# Patient Record
Sex: Female | Born: 1995 | Race: Black or African American | Hispanic: No | Marital: Single | Smoking: Former smoker
Health system: Southern US, Community
[De-identification: ages and names within clinical notes are randomized; demographics above are authoritative.]

## PROBLEM LIST (undated history)

## (undated) DIAGNOSIS — A539 Syphilis, unspecified: Secondary | ICD-10-CM

## (undated) DIAGNOSIS — N63 Unspecified lump in unspecified breast: Secondary | ICD-10-CM

## (undated) HISTORY — DX: Syphilis, unspecified: A53.9

## (undated) HISTORY — PX: UMBILICAL HERNIA REPAIR: SHX196

## (undated) HISTORY — PX: HERNIA REPAIR: SHX51

---
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
## 2014-02-13 ENCOUNTER — Emergency Department: Payer: Self-pay | Admitting: Emergency Medicine

## 2014-02-13 LAB — URINALYSIS, COMPLETE
BACTERIA: NONE SEEN
BILIRUBIN, UR: NEGATIVE
Glucose,UR: NEGATIVE mg/dL (ref 0–75)
NITRITE: NEGATIVE
Ph: 5 (ref 4.5–8.0)
Specific Gravity: 1.027 (ref 1.003–1.030)
Squamous Epithelial: 15

## 2014-05-04 ENCOUNTER — Emergency Department: Payer: Self-pay | Admitting: Internal Medicine

## 2015-06-03 ENCOUNTER — Emergency Department
Admission: EM | Admit: 2015-06-03 | Discharge: 2015-06-03 | Disposition: A | Discharge status: Home / Self Care | Attending: Emergency Medicine | Admitting: Emergency Medicine

## 2015-06-03 ENCOUNTER — Encounter: Payer: Self-pay | Admitting: *Deleted

## 2015-06-03 DIAGNOSIS — J069 Acute upper respiratory infection, unspecified: Secondary | ICD-10-CM | POA: Insufficient documentation

## 2015-06-03 DIAGNOSIS — Z3202 Encounter for pregnancy test, result negative: Secondary | ICD-10-CM | POA: Diagnosis not present

## 2015-06-03 DIAGNOSIS — R1032 Left lower quadrant pain: Secondary | ICD-10-CM | POA: Diagnosis present

## 2015-06-03 LAB — URINALYSIS COMPLETE WITH MICROSCOPIC (ARMC ONLY)
BILIRUBIN URINE: NEGATIVE
Bacteria, UA: NONE SEEN
GLUCOSE, UA: NEGATIVE mg/dL
Hgb urine dipstick: NEGATIVE
Ketones, ur: NEGATIVE mg/dL
Nitrite: NEGATIVE
PH: 6 (ref 5.0–8.0)
Protein, ur: NEGATIVE mg/dL
Specific Gravity, Urine: 1.025 (ref 1.005–1.030)

## 2015-06-03 LAB — COMPREHENSIVE METABOLIC PANEL
ALK PHOS: 64 U/L (ref 38–126)
ALT: 18 U/L (ref 14–54)
ANION GAP: 5 (ref 5–15)
AST: 20 U/L (ref 15–41)
Albumin: 4.2 g/dL (ref 3.5–5.0)
BILIRUBIN TOTAL: 0.4 mg/dL (ref 0.3–1.2)
BUN: 15 mg/dL (ref 6–20)
CALCIUM: 9.5 mg/dL (ref 8.9–10.3)
CO2: 32 mmol/L (ref 22–32)
CREATININE: 0.77 mg/dL (ref 0.44–1.00)
Chloride: 104 mmol/L (ref 101–111)
GFR calc non Af Amer: >60 mL/min (ref >60)
Glucose, Bld: 53 mg/dL — ABNORMAL LOW (ref 65–99)
Potassium: 3.4 mmol/L — ABNORMAL LOW (ref 3.5–5.1)
SODIUM: 141 mmol/L (ref 135–145)
Total Protein: 8.1 g/dL (ref 6.5–8.1)

## 2015-06-03 LAB — CBC
HCT: 39.3 % (ref 35.0–47.0)
Hemoglobin: 13 g/dL (ref 12.0–16.0)
MCH: 30.8 pg (ref 26.0–34.0)
MCHC: 33.1 g/dL (ref 32.0–36.0)
MCV: 92.8 fL (ref 80.0–100.0)
PLATELETS: 269 10*3/uL (ref 150–440)
RBC: 4.24 MIL/uL (ref 3.80–5.20)
RDW: 16.1 % — AB (ref 11.5–14.5)
WBC: 6.8 10*3/uL (ref 3.6–11.0)

## 2015-06-03 LAB — LIPASE, BLOOD: Lipase: 35 U/L (ref 11–51)

## 2015-06-03 LAB — POCT PREGNANCY, URINE: Preg Test, Ur: NEGATIVE

## 2015-06-03 MED ORDER — BENZONATATE 100 MG PO CAPS: 100.0000 mg | ORAL_CAPSULE | Freq: Three times a day (TID) | ORAL | Status: DC | PRN

## 2015-06-03 MED ORDER — FLUTICASONE PROPIONATE 50 MCG/ACT NA SUSP: 1.0000 | Freq: Every day | NASAL | Status: DC

## 2015-06-03 NOTE — ED Provider Notes (Signed)
[facility] Emergency Department Provider Note ____________________________________________  Time seen: 1141  I have reviewed the triage vital signs and the nursing notes.  HISTORY  Chief Complaint  URI and Abdominal Cramping  HPI Leslie Lucina MellowM Frith is a 19 y.o. female reports to the ED for evaluation of cough and congestion, intermittently over the last few weeks. She also notes some mild lower abdominal cramping on the left side. She denies any fever, nausea, vomiting, dizziness,pelvic pain, or vaginal discharge. She reports her last normal LMP was November 11. She is also requesting a pregnancy test because she feels like she is "showing". She is taking over-the-counter Robitussin for her cough congestion symptoms. She reports her milligram of pain is 3/10 in triage.  History reviewed. No pertinent past medical history.  There are no active problems to display for this patient.  No past surgical history on file.  Current Outpatient Rx  Name  Route  Sig  Dispense  Refill  . benzonatate (TESSALON PERLES) 100 MG capsule   Oral   Take 1 capsule (100 mg total) by mouth 3 (three) times daily as needed for cough (Take 1-2 per dose).   30 capsule   0   . fluticasone (FLONASE) 50 MCG/ACT nasal spray   Each Nare   Place 1 spray into both nostrils daily.   16 g   0    Allergies Asa  History reviewed. No pertinent family history.  Social History Social History  Substance Use Topics  . Smoking status: None  . Smokeless tobacco: None  . Alcohol Use: None   Review of Systems  Constitutional: Negative for fever. Eyes: Negative for visual changes. ENT: Negative for sore throat. Nasal congestion Cardiovascular: Negative for chest pain. Respiratory: Negative for shortness of breath. Gastrointestinal:   Positive abdominal pain. Negative for vomiting and diarrhea. Genitourinary: Negative for dysuria. Musculoskeletal: Negative for back pain. Skin: Negative  for rash. Neurological: Negative for headaches, focal weakness or numbness. ____________________________________________  PHYSICAL EXAM:  VITAL SIGNS: ED Triage Vitals  Enc Vitals Group     BP 06/03/15 1013 102/79 mmHg     Pulse Rate 06/03/15 1013 88     Resp 06/03/15 1013 18     Temp 06/03/15 1013 98.2 F (36.8 C)     Temp Source 06/03/15 1013 Oral     SpO2 06/03/15 1013 100 %     Weight 06/03/15 1013 130 lb (58.968 kg)     Height 06/03/15 1013 5\' 5"  (1.651 m)     Head Cir --      Peak Flow --      Pain Score 06/03/15 1013 3     Pain Loc --      Pain Edu? --      Excl. in GC? --    Constitutional: Alert and oriented. Well appearing and in no distress. Head: Normocephalic and atraumatic.      Eyes: Conjunctivae are normal. PERRL. Normal extraocular movements      Ears: Canals clear. TMs intact bilaterally.   Nose: No congestion/rhinorrhea.   Mouth/Throat: Mucous membranes are moist.   Neck: Supple. No thyromegaly. Hematological/Lymphatic/Immunological: No cervical lymphadenopathy. Cardiovascular: Normal rate, regular rhythm.  Respiratory: Normal respiratory effort. No wheezes/rales/rhonchi. Gastrointestinal: Soft and nontender. No distention, rebound, guarding, or organomegaly. Musculoskeletal: Nontender with normal range of motion in all extremities.  Neurologic:  Normal gait without ataxia. Normal speech and language. No gross focal neurologic deficits are appreciated. Skin:  Skin is warm, dry and intact. No  rash noted. Psychiatric: Mood and affect are normal. Patient exhibits appropriate insight and judgment. ____________________________________________    LABS (pertinent positives/negatives) Labs Reviewed  COMPREHENSIVE METABOLIC PANEL - Abnormal; Notable for the following:    Potassium 3.4 (*)    Glucose, Bld 53 (*)    All other components within normal limits  CBC - Abnormal; Notable for the following:    RDW 16.1 (*)    All other components within  normal limits  URINALYSIS COMPLETEWITH MICROSCOPIC ([facility] ONLY) - Abnormal; Notable for the following:    Color, Urine YELLOW (*)    APPearance CLEAR (*)    Leukocytes, UA TRACE (*)    Squamous Epithelial / LPF 6-30 (*)    All other components within normal limits  LIPASE, BLOOD  POC URINE PREG, ED  POCT PREGNANCY, URINE  ____________________________________________  INITIAL IMPRESSION / ASSESSMENT AND PLAN / ED COURSE  Patient with symptoms consistent with an upper respiratory infection, and lower abdominal cramping without indication of an acute abdominal process. Labs are reviewed with the patient and she is reassured by those findings. Patient is discharged with prescriptions for Tessalon Perles and Flonase dose as directed. She is also encouraged to dose and over-the-counter allergy medicine plus decongestant combo. She does follow with a primary care provider for ongoing symptoms. ____________________________________________  FINAL CLINICAL IMPRESSION(S) / ED DIAGNOSES  Final diagnoses:  URI (upper respiratory infection)  Abdominal cramping in left lower quadrant       Lissa Hoard, PA-C 06/03/15 1659  Jeanmarie Plant, MD 06/04/15 1440

## 2015-06-03 NOTE — ED Notes (Signed)
Pt states she feels like she might be pregnet because she feels like she is "showing", also states abd cramping and some URI such as cough and congestion

## 2015-06-03 NOTE — Discharge Instructions (Signed)
Upper Respiratory Infection, Adult °Most upper respiratory infections (URIs) are a viral infection of the air passages leading to the lungs. A URI affects the nose, throat, and upper air passages. The most common type of URI is nasopharyngitis and is typically referred to as "the common cold." °URIs run their course and usually go away on their own. Most of the time, a URI does not require medical attention, but sometimes a bacterial infection in the upper airways can follow a viral infection. This is called a secondary infection. Sinus and middle ear infections are common types of secondary upper respiratory infections. °Bacterial pneumonia can also complicate a URI. A URI can worsen asthma and chronic obstructive pulmonary disease (COPD). Sometimes, these complications can require emergency medical care and may be life threatening.  °CAUSES °Almost all URIs are caused by viruses. A virus is a type of germ and can spread from one person to another.  °RISKS FACTORS °You may be at risk for a URI if:  °· You smoke.   °· You have chronic heart or lung disease. °· You have a weakened defense (immune) system.   °· You are very young or very old.   °· You have nasal allergies or asthma. °· You work in crowded or poorly ventilated areas. °· You work in health care facilities or schools. °SIGNS AND SYMPTOMS  °Symptoms typically develop 2-3 days after you come in contact with a cold virus. Most viral URIs last 7-10 days. However, viral URIs from the influenza virus (flu virus) can last 14-18 days and are typically more severe. Symptoms may include:  °· Runny or stuffy (congested) nose.   °· Sneezing.   °· Cough.   °· Sore throat.   °· Headache.   °· Fatigue.   °· Fever.   °· Loss of appetite.   °· Pain in your forehead, behind your eyes, and over your cheekbones (sinus pain). °· Muscle aches.   °DIAGNOSIS  °Your health care provider may diagnose a URI by: °· Physical exam. °· Tests to check that your symptoms are not due to  another condition such as: °· Strep throat. °· Sinusitis. °· Pneumonia. °· Asthma. °TREATMENT  °A URI goes away on its own with time. It cannot be cured with medicines, but medicines may be prescribed or recommended to relieve symptoms. Medicines may help: °· Reduce your fever. °· Reduce your cough. °· Relieve nasal congestion. °HOME CARE INSTRUCTIONS  °· Take medicines only as directed by your health care provider.   °· Gargle warm saltwater or take cough drops to comfort your throat as directed by your health care provider. °· Use a warm mist humidifier or inhale steam from a shower to increase air moisture. This may make it easier to breathe. °· Drink enough fluid to keep your urine clear or pale yellow.   °· Eat soups and other clear broths and maintain good nutrition.   °· Rest as needed.   °· Return to work when your temperature has returned to normal or as your health care provider advises. You may need to stay home longer to avoid infecting others. You can also use a face mask and careful hand washing to prevent spread of the virus. °· Increase the usage of your inhaler if you have asthma.   °· Do not use any tobacco products, including cigarettes, chewing tobacco, or electronic cigarettes. If you need help quitting, ask your health care provider. °PREVENTION  °The best way to protect yourself from getting a cold is to practice good hygiene.  °· Avoid oral or hand contact with people with cold   symptoms.   Wash your hands often if contact occurs.  There is no clear evidence that vitamin C, vitamin E, echinacea, or exercise reduces the chance of developing a cold. However, it is always recommended to get plenty of rest, exercise, and practice good nutrition.  SEEK MEDICAL CARE IF:   You are getting worse rather than better.   Your symptoms are not controlled by medicine.   You have chills.  You have worsening shortness of breath.  You have brown or red mucus.  You have yellow or brown nasal  discharge.  You have pain in your face, especially when you bend forward.  You have a fever.  You have swollen neck glands.  You have pain while swallowing.  You have white areas in the back of your throat. SEEK IMMEDIATE MEDICAL CARE IF:   You have severe or persistent:  Headache.  Ear pain.  Sinus pain.  Chest pain.  You have chronic lung disease and any of the following:  Wheezing.  Prolonged cough.  Coughing up blood.  A change in your usual mucus.  You have a stiff neck.  You have changes in your:  Vision.  Hearing.  Thinking.  Mood. MAKE SURE YOU:   Understand these instructions.  Will watch your condition.  Will get help right away if you are not doing well or get worse.   This information is not intended to replace advice given to you by your health care provider. Make sure you discuss any questions you have with your health care provider.   Document Released: 12/06/2000 Document Revised: 10/27/2014 Document Reviewed: 09/17/2013 Elsevier Interactive Patient Education 2016 [employer]  Abdominal Pain, Adult Many things can cause belly (abdominal) pain. Most times, the belly pain is not dangerous. Many cases of belly pain can be watched and treated at home. HOME CARE   Do not take medicines that help you go poop (laxatives) unless told to by your doctor.  Only take medicine as told by your doctor.  Eat or drink as told by your doctor. Your doctor will tell you if you should be on a special diet. GET HELP IF:  You do not know what is causing your belly pain.  You have belly pain while you are sick to your stomach (nauseous) or have runny poop (diarrhea).  You have pain while you pee or poop.  Your belly pain wakes you up at night.  You have belly pain that gets worse or better when you eat.  You have belly pain that gets worse when you eat fatty foods.  You have a fever. GET HELP RIGHT AWAY IF:   The pain does not go away  within 2 hours.  You keep throwing up (vomiting).  The pain changes and is only in the right or left part of the belly.  You have bloody or tarry looking poop. MAKE SURE YOU:   Understand these instructions.  Will watch your condition.  Will get help right away if you are not doing well or get worse.   This information is not intended to replace advice given to you by your health care provider. Make sure you discuss any questions you have with your health care provider.   Document Released: 11/29/2007 Document Revised: 07/03/2014 Document Reviewed: 02/19/2013 Elsevier Interactive Patient Education [employer]  Your exam and labs are normal today. Continue to treat your symptoms and start the prescription meds as directed.  Follow-up with your provider of [facility]  as needed. Start a combo allergy medicine + pseudoephedrine (Allegra-D, Claritin-D, or Zyrtec-D) for sinus congestion symptoms.

## 2015-06-03 NOTE — ED Notes (Signed)
States she has had cough   Congestion for couple of weeks. Lungs clear .Also, she has additional complaints of abd cramping . No n/v unsure of fever

## 2015-07-27 ENCOUNTER — Other Ambulatory Visit: Payer: Self-pay | Admitting: Primary Care

## 2015-07-27 DIAGNOSIS — N63 Unspecified lump in unspecified breast: Secondary | ICD-10-CM

## 2015-08-05 ENCOUNTER — Ambulatory Visit
Admission: RE | Admit: 2015-08-05 | Discharge: 2015-08-05 | Disposition: A | Discharge status: Home / Self Care | Source: Ambulatory Visit | Attending: Primary Care | Admitting: Primary Care

## 2015-08-05 DIAGNOSIS — Z803 Family history of malignant neoplasm of breast: Secondary | ICD-10-CM | POA: Insufficient documentation

## 2015-08-05 DIAGNOSIS — N63 Unspecified lump in unspecified breast: Secondary | ICD-10-CM

## 2015-08-05 HISTORY — DX: Unspecified lump in unspecified breast: N63.0

## 2016-07-29 ENCOUNTER — Emergency Department
Admission: EM | Admit: 2016-07-29 | Discharge: 2016-07-29 | Disposition: A | Discharge status: Home / Self Care | Attending: Emergency Medicine | Admitting: Emergency Medicine

## 2016-07-29 DIAGNOSIS — J101 Influenza due to other identified influenza virus with other respiratory manifestations: Secondary | ICD-10-CM

## 2016-07-29 DIAGNOSIS — J09X2 Influenza due to identified novel influenza A virus with other respiratory manifestations: Secondary | ICD-10-CM | POA: Insufficient documentation

## 2016-07-29 DIAGNOSIS — J029 Acute pharyngitis, unspecified: Secondary | ICD-10-CM

## 2016-07-29 DIAGNOSIS — Z79899 Other long term (current) drug therapy: Secondary | ICD-10-CM | POA: Insufficient documentation

## 2016-07-29 LAB — INFLUENZA PANEL BY PCR (TYPE A & B)
INFLBPCR: NEGATIVE
Influenza A By PCR: POSITIVE — AB

## 2016-07-29 LAB — POCT RAPID STREP A: Streptococcus, Group A Screen (Direct): NEGATIVE

## 2016-07-29 MED ORDER — CEPHALEXIN 500 MG PO CAPS: 500.0000 mg | ORAL_CAPSULE | Freq: Two times a day (BID) | ORAL | 0 refills | Status: DC

## 2016-07-29 MED ORDER — CEPHALEXIN 500 MG PO CAPS
500.0000 mg | ORAL_CAPSULE | Freq: Once | ORAL | Status: AC
  Administered 2016-07-29: 500 mg via ORAL

## 2016-07-29 MED ORDER — CEPHALEXIN 500 MG PO CAPS
ORAL_CAPSULE | ORAL | Status: AC
  Filled 2016-07-29: qty 1

## 2016-07-29 NOTE — ED Provider Notes (Signed)
[facility] Emergency Department Provider Note  Time seen: 10:39 PM  I have reviewed the triage vital signs and the nursing notes.   HISTORY  Chief Complaint Sore Throat and Dizziness    HPI Leslie Gilbert is a 21 y.o. female with no past medical history who presents the emergency department with a sore throat. According to the patient for the past several days she has been experiencing a significant sore throat now with swollen tonsils and white spots on her tonsils. Patient states mild cough, no known fever.  Past Medical History:  Diagnosis Date  . Breast mass     There are no active problems to display for this patient.   No past surgical history on file.  Prior to Admission medications   Medication Sig Start Date End Date Taking? Authorizing Provider  benzonatate (TESSALON PERLES) 100 MG capsule Take 1 capsule (100 mg total) by mouth 3 (three) times daily as needed for cough (Take 1-2 per dose). 06/03/15   Jenise V Bacon Menshew, PA-C  fluticasone (FLONASE) 50 MCG/ACT nasal spray Place 1 spray into both nostrils daily. 06/03/15   Jenise V Bacon Menshew, PA-C    Allergies  Allergen Reactions  . Asa [Aspirin]     Family History  Problem Relation Age of Onset  . Breast cancer Mother   . Breast cancer Other     Social History Social History  Substance Use Topics  . Smoking status: Not on file  . Smokeless tobacco: Not on file  . Alcohol use Not on file    Review of Systems Constitutional: Negative for fever. Positive for sore throat. Cardiovascular: Negative for chest pain. Respiratory: Negative for shortness of breath. Occasional cough Gastrointestinal: Negative for abdominal pain Neurological: Negative for headache 10-point ROS otherwise negative.  ____________________________________________   PHYSICAL EXAM:  VITAL SIGNS: ED Triage Vitals  Enc Vitals Group     BP 07/29/16 2014 128/65     Pulse Rate 07/29/16 2014 (!) 105   Resp 07/29/16 2014 18     Temp 07/29/16 2014 98.4 F (36.9 C)     Temp Source 07/29/16 2014 Oral     SpO2 07/29/16 2014 98 %     Weight 07/29/16 2013 128 lb (58.1 kg)     Height 07/29/16 2013 5\' 5"  (1.651 m)     Head Circumference --      Peak Flow --      Pain Score --      Pain Loc --      Pain Edu? --      Excl. in GC? --     Constitutional: Alert and oriented. Well appearing and in no distress. Eyes: Normal exam ENT   Head: Normocephalic and atraumatic.   Mouth/Throat: Mucous membranes are moist.Moderately swollen tonsils bilaterally with erythema and exudates. Cardiovascular: Normal rate, regular rhythm. No murmur Respiratory: Normal respiratory effort without tachypnea nor retractions. Breath sounds are clear  Gastrointestinal: Soft and nontender. No distention.   Musculoskeletal: Nontender with normal range of motion in all extremities Neurologic:  Normal speech and language. No gross focal neurologic deficits  Skin:  Skin is warm, dry and intact.  Psychiatric: Mood and affect are normal.   ____________________________________________   INITIAL IMPRESSION / ASSESSMENT AND PLAN / ED COURSE  Pertinent labs & imaging results that were available during my care of the patient were reviewed by me and considered in my medical decision making (see chart for details).  Patient presents with significant sore  throat, moderately swollen tonsils with exudate bilaterally, we will cover with Keflex for likely strep throat. Point-of-care strep is negative, culture has been sent.  Patient's influenza test results resulted positive. I discussed supportive care. Patient will be discharged with Keflex, Tylenol or Motrin as needed as written on the box, plenty of fluids/supportive care.  ____________________________________________   FINAL CLINICAL IMPRESSION(S) / ED DIAGNOSES  Influenza Pharyngitis    Minna AntisKevin Judythe Postema, MD 07/29/16 2241

## 2016-07-29 NOTE — ED Triage Notes (Signed)
Reports symptoms began on Thursday.  Patient reports sore throat (states "white spots" in throat) and dizzy.  "I want to get checked for the flu."

## 2016-07-29 NOTE — ED Notes (Signed)

## 2016-07-29 NOTE — ED Notes (Signed)
Pt denies congestion, cough. States sore throat x2 days, reports flu exposure.

## 2016-08-01 LAB — CULTURE, GROUP A STREP (THRC)

## 2017-01-02 LAB — HM HIV SCREENING LAB: HM HIV Screening: NEGATIVE

## 2017-05-05 ENCOUNTER — Emergency Department
Admission: EM | Admit: 2017-05-05 | Discharge: 2017-05-05 | Disposition: A | Discharge status: Home / Self Care | Payer: No Typology Code available for payment source | Attending: Emergency Medicine | Admitting: Emergency Medicine

## 2017-05-05 ENCOUNTER — Emergency Department: Payer: No Typology Code available for payment source

## 2017-05-05 ENCOUNTER — Other Ambulatory Visit: Payer: Self-pay

## 2017-05-05 ENCOUNTER — Encounter: Payer: Self-pay | Admitting: *Deleted

## 2017-05-05 DIAGNOSIS — Z79899 Other long term (current) drug therapy: Secondary | ICD-10-CM | POA: Diagnosis not present

## 2017-05-05 DIAGNOSIS — F17210 Nicotine dependence, cigarettes, uncomplicated: Secondary | ICD-10-CM | POA: Diagnosis not present

## 2017-05-05 DIAGNOSIS — M7918 Myalgia, other site: Secondary | ICD-10-CM | POA: Insufficient documentation

## 2017-05-05 MED ORDER — IBUPROFEN 600 MG PO TABS
600.0000 mg | ORAL_TABLET | Freq: Once | ORAL | Status: AC
  Administered 2017-05-05: 600 mg via ORAL
  Filled 2017-05-05: qty 1

## 2017-05-05 MED ORDER — IBUPROFEN 600 MG PO TABS: 600.0000 mg | ORAL_TABLET | Freq: Four times a day (QID) | ORAL | 0 refills | Status: DC | PRN

## 2017-05-05 MED ORDER — CYCLOBENZAPRINE HCL 5 MG PO TABS: 5.0000 mg | ORAL_TABLET | Freq: Three times a day (TID) | ORAL | 0 refills | Status: AC | PRN

## 2017-05-05 MED ORDER — ORPHENADRINE CITRATE 30 MG/ML IJ SOLN
60.0000 mg | Freq: Two times a day (BID) | INTRAMUSCULAR | Status: DC
  Administered 2017-05-05: 60 mg via INTRAMUSCULAR
  Filled 2017-05-05: qty 2
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
## 2017-05-05 NOTE — ED Notes (Signed)
Patient resting quietly. Cervical collar removed her PA. Patient requesting crackers and peanut butter, given. Will continue to monitor.
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
## 2019-01-14 ENCOUNTER — Other Ambulatory Visit: Payer: Self-pay

## 2019-01-14 ENCOUNTER — Encounter: Payer: Self-pay | Admitting: Emergency Medicine

## 2019-01-14 ENCOUNTER — Emergency Department
Admission: EM | Admit: 2019-01-14 | Discharge: 2019-01-14 | Disposition: A | Discharge status: Home / Self Care | Attending: Emergency Medicine | Admitting: Emergency Medicine

## 2019-01-14 DIAGNOSIS — Z202 Contact with and (suspected) exposure to infections with a predominantly sexual mode of transmission: Secondary | ICD-10-CM | POA: Insufficient documentation

## 2019-01-14 DIAGNOSIS — N764 Abscess of vulva: Secondary | ICD-10-CM | POA: Insufficient documentation

## 2019-01-14 DIAGNOSIS — F17210 Nicotine dependence, cigarettes, uncomplicated: Secondary | ICD-10-CM | POA: Insufficient documentation

## 2019-01-14 DIAGNOSIS — L0291 Cutaneous abscess, unspecified: Secondary | ICD-10-CM

## 2019-01-14 LAB — URINALYSIS, COMPLETE (UACMP) WITH MICROSCOPIC
Bacteria, UA: NONE SEEN
Bilirubin Urine: NEGATIVE
Glucose, UA: NEGATIVE mg/dL
Hgb urine dipstick: NEGATIVE
Ketones, ur: NEGATIVE mg/dL
Nitrite: NEGATIVE
Protein, ur: 30 mg/dL — AB
Specific Gravity, Urine: 1.03 (ref 1.005–1.030)
pH: 5 (ref 5.0–8.0)

## 2019-01-14 LAB — POCT PREGNANCY, URINE: Preg Test, Ur: NEGATIVE

## 2019-01-14 MED ORDER — ACYCLOVIR 400 MG PO TABS: 400.0000 mg | ORAL_TABLET | Freq: Every day | ORAL | 0 refills | Status: AC

## 2019-01-14 MED ORDER — SULFAMETHOXAZOLE-TRIMETHOPRIM 800-160 MG PO TABS: 1.0000 | ORAL_TABLET | Freq: Two times a day (BID) | ORAL | 0 refills | Status: DC

## 2019-01-14 NOTE — ED Triage Notes (Signed)
Patient presents to ED via POV from home. Patient reports being told by a sexual partner that she needs to get tested for STD's. Patient reports one bump on her vagina. Patient denies any other symptoms.

## 2019-01-14 NOTE — ED Notes (Signed)
See triage note  Presents with possible STD  States she was told by sexual partner to be checked  Also noticed a white "bump" to vaginal area

## 2019-01-14 NOTE — ED Provider Notes (Signed)
[facility] Emergency Department Provider Note   ____________________________________________   First MD Initiated Contact with Patient 01/14/19 0940     (approximate)  I have reviewed the triage vital signs and the nursing notes.   HISTORY  Chief Complaint Exposure to STD    HPI Leslie Gilbert is a [age] y.o. female patient presents for evaluation for exposure to STD.  Patient states she was told by her sexual partner that she needed be evaluated for STD.  Patient suspect her patient has herpes simplex.  Patient denies any complaints except for solitary lesion on inferior left labia majora.         Past Medical History:  Diagnosis Date  . Breast mass     There are no active problems to display for this patient.   History reviewed. No pertinent surgical history.  Prior to Admission medications   Medication Sig Start Date End Date Taking? Authorizing Provider  acyclovir (ZOVIRAX) 400 MG tablet Take 1 tablet (400 mg total) by mouth 5 (five) times daily for 10 days. 01/14/19 01/24/19  Joni ReiningSmith,  K, PA-C  sulfamethoxazole-trimethoprim (BACTRIM DS) 800-160 MG tablet Take 1 tablet by mouth 2 (two) times daily. 01/14/19   Joni ReiningSmith,  K, PA-C    Allergies Asa [aspirin]  Family History  Problem Relation Age of Onset  . Breast cancer Mother   . Breast cancer Other     Social History Social History   Tobacco Use  . Smoking status: Current Every Day Smoker    Packs/day: 0.50    Types: Cigarettes  . Smokeless tobacco: Never Used  Substance Use Topics  . Alcohol use: Yes    Comment: Social   . Drug use: Not on file    Review of Systems Constitutional: No fever/chills Eyes: No visual changes. ENT: No sore throat. Cardiovascular: Denies chest pain. Respiratory: Denies shortness of breath. Gastrointestinal: No abdominal pain.  No nausea, no vomiting.  No diarrhea.  No constipation. Genitourinary: Negative for dysuria. Musculoskeletal:  Negative for back pain. Skin: Negative for rash.  Nodule lesion on external vaginal area. Neurological: Negative for headaches, focal weakness or numbness. Allergic/Immunilogical: ASA. ____________________________________________   PHYSICAL EXAM:  VITAL SIGNS: ED Triage Vitals  Enc Vitals Group     BP 01/14/19 0927 115/81     Pulse Rate 01/14/19 0927 100     Resp 01/14/19 0927 17     Temp 01/14/19 0927 98.5 F (36.9 C)     Temp Source 01/14/19 0927 Oral     SpO2 01/14/19 0927 99 %     Weight 01/14/19 0928 120 lb (54.4 kg)     Height 01/14/19 0928 5\' 5"  (1.651 m)     Head Circumference --      Peak Flow --      Pain Score 01/14/19 0928 0     Pain Loc --      Pain Edu? --      Excl. in GC? --     Constitutional: Alert and oriented. Well appearing and in no acute distress. Hematological/Lymphatic/Immunilogical: No cervical lymphadenopathy. Cardiovascular: Normal rate, regular rhythm. Grossly normal heart sounds.  Good peripheral circulation. Respiratory: Normal respiratory effort.  No retractions. Lungs CTAB. Neurologic:  Normal speech and language. No gross focal neurologic deficits are appreciated. No gait instability. Skin: Chaperoned vaginal exam reveals solitary nonfluctuant nodule lesion left inferior labia majora.  Nontender palpation.  No drainage. Psychiatric: Mood and affect are normal. Speech and behavior are normal.  ____________________________________________  LABS (all labs ordered are listed, but only abnormal results are displayed)  Labs Reviewed  URINALYSIS, COMPLETE (UACMP) WITH MICROSCOPIC - Abnormal; Notable for the following components:      Result Value   Color, Urine YELLOW (*)    APPearance HAZY (*)    Protein, ur 30 (*)    Leukocytes,Ua SMALL (*)    All other components within normal limits  POC URINE PREG, ED  POCT PREGNANCY, URINE   ____________________________________________  EKG   ____________________________________________   RADIOLOGY  ED MD interpretation:    Official radiology report(s): No results found.  ____________________________________________   PROCEDURES  Procedure(s) performed (including Critical Care):  Procedures   ____________________________________________   INITIAL IMPRESSION / ASSESSMENT AND PLAN / ED COURSE  As part of my medical decision making, I reviewed the following data within the [facility]        Patient presents STD evaluation.  Patient states sexual partner mention herpes.  Patient is asymptomatic several solitary nodule lesion at the left inferior labia majora consistent with an abscess.  Discussed rationale for not incised and drained at this time.  Patient given discharge care instruction advised take medication as directed.  Patient advised follow-up with the [facility] for definitive evaluation STD.   ____________________________________________   FINAL CLINICAL IMPRESSION(S) / ED DIAGNOSES  Final diagnoses:  Possible exposure to STD  Abscess     ED Discharge Orders         Ordered    sulfamethoxazole-trimethoprim (BACTRIM DS) 800-160 MG tablet  2 times daily     01/14/19 1019    acyclovir (ZOVIRAX) 400 MG tablet  5 times daily     01/14/19 1019           Note:  This document was prepared using Dragon voice recognition software and may include unintentional dictation errors.    Sable Feil, PA-C 01/14/19 1026    Earleen Newport, MD 01/14/19 1051

## 2019-01-14 NOTE — Discharge Instructions (Signed)
Follow discharge care instruction take medication as directed. °

## 2019-07-09 ENCOUNTER — Other Ambulatory Visit: Payer: Self-pay

## 2019-07-09 ENCOUNTER — Encounter: Payer: Self-pay | Admitting: *Deleted

## 2019-07-09 DIAGNOSIS — L03116 Cellulitis of left lower limb: Secondary | ICD-10-CM | POA: Insufficient documentation

## 2019-07-09 DIAGNOSIS — Y999 Unspecified external cause status: Secondary | ICD-10-CM | POA: Insufficient documentation

## 2019-07-09 DIAGNOSIS — Y929 Unspecified place or not applicable: Secondary | ICD-10-CM | POA: Insufficient documentation

## 2019-07-09 DIAGNOSIS — L02416 Cutaneous abscess of left lower limb: Secondary | ICD-10-CM | POA: Insufficient documentation

## 2019-07-09 DIAGNOSIS — S80252A Superficial foreign body, left knee, initial encounter: Secondary | ICD-10-CM | POA: Insufficient documentation

## 2019-07-09 DIAGNOSIS — Z23 Encounter for immunization: Secondary | ICD-10-CM | POA: Insufficient documentation

## 2019-07-09 DIAGNOSIS — Y939 Activity, unspecified: Secondary | ICD-10-CM | POA: Insufficient documentation

## 2019-07-09 DIAGNOSIS — F17210 Nicotine dependence, cigarettes, uncomplicated: Secondary | ICD-10-CM | POA: Insufficient documentation

## 2019-07-09 DIAGNOSIS — X58XXXA Exposure to other specified factors, initial encounter: Secondary | ICD-10-CM | POA: Insufficient documentation

## 2019-07-09 LAB — COMPREHENSIVE METABOLIC PANEL
ALT: 18 U/L (ref 0–44)
AST: 23 U/L (ref 15–41)
Albumin: 4.1 g/dL (ref 3.5–5.0)
Alkaline Phosphatase: 66 U/L (ref 38–126)
Anion gap: 10 (ref 5–15)
BUN: 10 mg/dL (ref 6–20)
CO2: 29 mmol/L (ref 22–32)
Calcium: 9.3 mg/dL (ref 8.9–10.3)
Chloride: 98 mmol/L (ref 98–111)
Creatinine, Ser: 0.8 mg/dL (ref 0.44–1.00)
GFR calc Af Amer: >60 mL/min (ref >60)
GFR calc non Af Amer: >60 mL/min (ref >60)
Glucose, Bld: 99 mg/dL (ref 70–99)
Potassium: 3.5 mmol/L (ref 3.5–5.1)
Sodium: 137 mmol/L (ref 135–145)
Total Bilirubin: 0.8 mg/dL (ref 0.3–1.2)
Total Protein: 8.4 g/dL — ABNORMAL HIGH (ref 6.5–8.1)

## 2019-07-09 LAB — CBC WITH DIFFERENTIAL/PLATELET
Abs Immature Granulocytes: 0.07 10*3/uL (ref 0.00–0.07)
Basophils Absolute: 0 10*3/uL (ref 0.0–0.1)
Basophils Relative: 0 %
Eosinophils Absolute: 0 10*3/uL (ref 0.0–0.5)
Eosinophils Relative: 0 %
HCT: 40.9 % (ref 36.0–46.0)
Hemoglobin: 13.2 g/dL (ref 12.0–15.0)
Immature Granulocytes: 1 %
Lymphocytes Relative: 24 %
Lymphs Abs: 3.6 10*3/uL (ref 0.7–4.0)
MCH: 29.8 pg (ref 26.0–34.0)
MCHC: 32.3 g/dL (ref 30.0–36.0)
MCV: 92.3 fL (ref 80.0–100.0)
Monocytes Absolute: 1.3 10*3/uL — ABNORMAL HIGH (ref 0.1–1.0)
Monocytes Relative: 9 %
Neutro Abs: 9.8 10*3/uL — ABNORMAL HIGH (ref 1.7–7.7)
Neutrophils Relative %: 66 %
Platelets: 378 10*3/uL (ref 150–400)
RBC: 4.43 MIL/uL (ref 3.87–5.11)
RDW: 14.7 % (ref 11.5–15.5)
WBC: 14.8 10*3/uL — ABNORMAL HIGH (ref 4.0–10.5)
nRBC: 0 % (ref 0.0–0.2)

## 2019-07-09 NOTE — ED Triage Notes (Signed)
Pt to triage via wheelchair.  Pt has abscess to left knee.  Area red and swollen.  No drainage.   Pt states a spider may have bitten her. Pt alert.

## 2019-07-10 ENCOUNTER — Emergency Department
Admission: EM | Admit: 2019-07-10 | Discharge: 2019-07-10 | Disposition: A | Discharge status: Home / Self Care | Payer: Medicaid Other | Attending: Student in an Organized Health Care Education/Training Program | Admitting: Student in an Organized Health Care Education/Training Program

## 2019-07-10 ENCOUNTER — Emergency Department: Payer: Medicaid Other

## 2019-07-10 DIAGNOSIS — M795 Residual foreign body in soft tissue: Secondary | ICD-10-CM

## 2019-07-10 DIAGNOSIS — L0291 Cutaneous abscess, unspecified: Secondary | ICD-10-CM

## 2019-07-10 DIAGNOSIS — L03116 Cellulitis of left lower limb: Secondary | ICD-10-CM

## 2019-07-10 LAB — POCT PREGNANCY, URINE: Preg Test, Ur: NEGATIVE

## 2019-07-10 MED ORDER — BACITRACIN ZINC 500 UNIT/GM EX OINT
TOPICAL_OINTMENT | Freq: Once | CUTANEOUS | Status: AC
  Administered 2019-07-10: 1 via TOPICAL
  Filled 2019-07-10: qty 0.9

## 2019-07-10 MED ORDER — SULFAMETHOXAZOLE-TRIMETHOPRIM 800-160 MG PO TABS: 1.0000 | ORAL_TABLET | Freq: Two times a day (BID) | ORAL | 0 refills | Status: AC

## 2019-07-10 MED ORDER — HYDROCODONE-ACETAMINOPHEN 5-325 MG PO TABS
1.0000 | ORAL_TABLET | Freq: Once | ORAL | Status: AC
  Administered 2019-07-10: 1 via ORAL
  Filled 2019-07-10: qty 1

## 2019-07-10 MED ORDER — TETANUS-DIPHTH-ACELL PERTUSSIS 5-2.5-18.5 LF-MCG/0.5 IM SUSP
0.5000 mL | Freq: Once | INTRAMUSCULAR | Status: AC
  Administered 2019-07-10: 0.5 mL via INTRAMUSCULAR
  Filled 2019-07-10: qty 0.5

## 2019-07-10 MED ORDER — BUPIVACAINE HCL (PF) 0.5 % IJ SOLN
30.0000 mL | Freq: Once | INTRAMUSCULAR | Status: AC
  Administered 2019-07-10: 30 mL

## 2019-07-10 MED ORDER — SULFAMETHOXAZOLE-TRIMETHOPRIM 800-160 MG PO TABS
1.0000 | ORAL_TABLET | Freq: Once | ORAL | Status: AC
  Administered 2019-07-10: 1 via ORAL
  Filled 2019-07-10: qty 1

## 2019-07-10 MED ORDER — LIDOCAINE-PRILOCAINE 2.5-2.5 % EX CREA
TOPICAL_CREAM | Freq: Once | CUTANEOUS | Status: AC
  Filled 2019-07-10: qty 5

## 2019-07-10 NOTE — ED Provider Notes (Signed)
[facility] Emergency Department Provider Note    First MD Initiated Contact with Patient 07/10/19 [phone]     (approximate)  I have reviewed the triage vital signs and the nursing notes.   HISTORY  Chief Complaint Abscess    HPI Leslie Gilbert is a 24 y.o. female presents the ER for evaluation of acute left knee pain and swelling.  Does not recall any injury.  States she got a "spider bite "feels it is infected.  Is not been on recent antibiotics.  Has had some chills but no measured fevers.  States that she noticed the redness rapidly expanding around her knee.  Rates pain and mild to moderate.    Past Medical History:  Diagnosis Date  . Breast mass    Family History  Problem Relation Age of Onset  . Breast cancer Mother   . Breast cancer Other    No past surgical history on file. There are no problems to display for this patient.     Prior to Admission medications   Medication Sig Start Date End Date Taking? Authorizing Provider  sulfamethoxazole-trimethoprim (BACTRIM DS) 800-160 MG tablet Take 1 tablet by mouth 2 (two) times daily. 01/14/19   Joni Reining, PA-C  sulfamethoxazole-trimethoprim (BACTRIM DS) 800-160 MG tablet Take 1 tablet by mouth 2 (two) times daily for 7 days. 07/10/19 07/17/19  Willy Eddy, MD    Allergies Jonne Ply [aspirin]    Social History Social History   Tobacco Use  . Smoking status: Current Every Day Smoker    Packs/day: 0.50    Types: Cigarettes  . Smokeless tobacco: Never Used  Substance Use Topics  . Alcohol use: Not Currently    Comment: Social   . Drug use: Not on file    Review of Systems Patient denies headaches, rhinorrhea, blurry vision, numbness, shortness of breath, chest pain, edema, cough, abdominal pain, nausea, vomiting, diarrhea, dysuria, fevers, rashes or hallucinations unless otherwise stated above in HPI. ____________________________________________   PHYSICAL EXAM:  VITAL  SIGNS: Vitals:   07/10/19 0215 07/10/19 0411  BP: 123/71 122/68  Pulse: (!) 114 99  Resp: (!) 24 (!) 22  Temp: 98.5 F (36.9 C)   SpO2: 99% 99%    Constitutional: Alert and oriented.  Eyes: Conjunctivae are normal.  Head: Atraumatic. Nose: No congestion/rhinnorhea. Mouth/Throat: Mucous membranes are moist.   Neck: No stridor. Painless ROM.  Cardiovascular: Normal rate, regular rhythm. Grossly normal heart sounds.  Good peripheral circulation. Respiratory: Normal respiratory effort.  No retractions. Lungs CTAB. Gastrointestinal: Soft and nontender. No distention. No abdominal bruits. No CVA tenderness. Genitourinary:  Musculoskeletal: Swelling pain and erythema to left anterior knee With small 1 cm abrasion.  No purulent drainage.  No knee effusion.  Does have some streaking cellulitis around that area..  No joint effusions. Neurologic:  Normal speech and language. No gross focal neurologic deficits are appreciated. No facial droop Skin:  Skin is warm, dry and intact. No rash noted. Psychiatric: Mood and affect are normal. Speech and behavior are normal.  ____________________________________________   LABS (all labs ordered are listed, but only abnormal results are displayed)  Results for orders placed or performed during the hospital encounter of 07/10/19 (from the past 24 hour(s))  Comprehensive metabolic panel     Status: Abnormal   Collection Time: 07/09/19 10:45 PM  Result Value Ref Range   Sodium 137 135 - 145 mmol/L   Potassium 3.5 3.5 - 5.1 mmol/L   Chloride 98 98 -  111 mmol/L   CO2 29 22 - 32 mmol/L   Glucose, Bld 99 70 - 99 mg/dL   BUN 10 6 - 20 mg/dL   Creatinine, Ser 0.80 0.44 - 1.00 mg/dL   Calcium 9.3 8.9 - 10.3 mg/dL   Total Protein 8.4 (H) 6.5 - 8.1 g/dL   Albumin 4.1 3.5 - 5.0 g/dL   AST 23 15 - 41 U/L   ALT 18 0 - 44 U/L   Alkaline Phosphatase 66 38 - 126 U/L   Total Bilirubin 0.8 0.3 - 1.2 mg/dL   GFR calc non Af Amer >60 >60 mL/min   GFR calc Af  Amer >60 >60 mL/min   Anion gap 10 5 - 15  CBC with Differential     Status: Abnormal   Collection Time: 07/09/19 10:45 PM  Result Value Ref Range   WBC 14.8 (H) 4.0 - 10.5 K/uL   RBC 4.43 3.87 - 5.11 MIL/uL   Hemoglobin 13.2 12.0 - 15.0 g/dL   HCT 40.9 36.0 - 46.0 %   MCV 92.3 80.0 - 100.0 fL   MCH 29.8 26.0 - 34.0 pg   MCHC 32.3 30.0 - 36.0 g/dL   RDW 14.7 11.5 - 15.5 %   Platelets 378 150 - 400 K/uL   nRBC 0.0 0.0 - 0.2 %   Neutrophils Relative % 66 %   Neutro Abs 9.8 (H) 1.7 - 7.7 K/uL   Lymphocytes Relative 24 %   Lymphs Abs 3.6 0.7 - 4.0 K/uL   Monocytes Relative 9 %   Monocytes Absolute 1.3 (H) 0.1 - 1.0 K/uL   Eosinophils Relative 0 %   Eosinophils Absolute 0.0 0.0 - 0.5 K/uL   Basophils Relative 0 %   Basophils Absolute 0.0 0.0 - 0.1 K/uL   Immature Granulocytes 1 %   Abs Immature Granulocytes 0.07 0.00 - 0.07 K/uL  Pregnancy, urine POC     Status: None   Collection Time: 07/10/19  3:54 AM  Result Value Ref Range   Preg Test, Ur NEGATIVE NEGATIVE   ____________________________________________ _______________________________  RADIOLOGY  I personally reviewed all radiographic images ordered to evaluate for the above acute complaints and reviewed radiology reports and findings.  These findings were personally discussed with the patient.  Please see medical record for radiology report.   EMERGENCY DEPARTMENT US SOFT TISSUE INTERPRETATION "Study: Limited Soft Tissue Ultrasound"  INDICATIONS: Soft tissue infection Multiple views of the body part were obtained in real-time with a multi-frequency linear probe  PERFORMED BY: Myself IMAGES ARCHIVED?: No SIDE:Left BODY PART:Lower extremity INTERPRETATION:  Abcess present and foreign body noted    ____________________________________________   PROCEDURES  Procedure(s) performed:  Marland KitchenMarland KitchenIncision and Drainage  Date/Time: 07/10/2019 5:05 AM Performed by: Merlyn Lot, MD Authorized by: Merlyn Lot, MD    Consent:    Consent obtained:  Verbal   Consent given by:  Patient   Risks discussed:  Bleeding, infection, incomplete drainage and pain   Alternatives discussed:  Alternative treatment, delayed treatment and observation Location:    Type:  Abscess   Location:  Lower extremity   Lower extremity location:  Knee   Knee location:  L knee Pre-procedure details:    Skin preparation:  Betadine Anesthesia (see MAR for exact dosages):    Anesthesia method:  Local infiltration and topical application   Topical anesthetic:  EMLA cream   Local anesthetic:  Bupivacaine 0.5% w/o epi Procedure type:    Complexity:  Complex Procedure details:    Incision types:  Stab incision   Incision depth:  Subcutaneous   Scalpel blade:  11   Wound management:  Probed and deloculated, irrigated with saline, extensive cleaning and debrided   Drainage:  Bloody and purulent   Drainage amount:  Scant Post-procedure details:    Patient tolerance of procedure:  Tolerated well, no immediate complications .Foreign Body Removal  Date/Time: 07/10/2019 5:07 AM Performed by: Willy Eddy, MD Authorized by: Willy Eddy, MD  Body area: skin General location: lower extremity Location details: left knee  Anesthesia: Local Anesthetic: bupivacaine 0.5% without epinephrine Patient cooperative: yes Localization method: probed and ultrasound Removal mechanism: forceps Dressing: dressing applied Depth: subcutaneous Complexity: simple 1 objects recovered. Objects recovered: shard of glass Post-procedure assessment: foreign body removed Patient tolerance: patient tolerated the procedure well with no immediate complications      Critical Care performed: no ____________________________________________   INITIAL IMPRESSION / ASSESSMENT AND PLAN / ED COURSE  Pertinent labs & imaging results that were available during my care of the patient were reviewed by me and considered in my medical decision  making (see chart for details).   DDX: abscess,cellulitis, lymphangitis, FB, fracture, bursitis  Aubreanna M Mistry is a 24 y.o. who presents to the ED with abscess cellulitis secondary to foreign body.  I&D and foreign body removal performed as described above.  Patient tolerated well.  States that she did remember that she was helping her mother clean motel room and was on her knee on the carpet at one point.  This was 2 to 3 days ago which would explain today's presentation.  Patient tolerating oral hydration.  Erythema already seems to be improving after single dose of Bactrim.  At this point I do believe she stable and appropriate for outpatient follow-up.  Have discussed with the patient and available family all diagnostics and treatments performed thus far and all questions were answered to the best of my ability. The patient demonstrates understanding and agreement with plan.      The patient was evaluated in Emergency Department today for the symptoms described in the history of present illness. He/she was evaluated in the context of the global COVID-19 pandemic, which necessitated consideration that the patient might be at risk for infection with the SARS-CoV-2 virus that causes COVID-19. Institutional protocols and algorithms that pertain to the evaluation of patients at risk for COVID-19 are in a state of rapid change based on information released by regulatory bodies including the CDC and federal and state organizations. These policies and algorithms were followed during the patient's care in the ED.  As part of my medical decision making, I reviewed the following data within the electronic MEDICAL RECORD NUMBER Nursing notes reviewed and incorporated, Labs reviewed, notes from prior ED visits and [place] Controlled Substance Database   ____________________________________________   FINAL CLINICAL IMPRESSION(S) / ED DIAGNOSES  Final diagnoses:  Abscess  Cellulitis of left lower extremity    Foreign body (FB) in soft tissue      NEW MEDICATIONS STARTED DURING THIS VISIT:  New Prescriptions   SULFAMETHOXAZOLE-TRIMETHOPRIM (BACTRIM DS) 800-160 MG TABLET    Take 1 tablet by mouth 2 (two) times daily for 7 days.     Note:  This document was prepared using Dragon voice recognition software and may include unintentional dictation errors.    Willy Eddy, MD 07/10/19 [phone]

## 2020-03-15 ENCOUNTER — Ambulatory Visit: Payer: Self-pay

## 2020-03-18 ENCOUNTER — Ambulatory Visit: Payer: Medicaid Other

## 2020-03-18 ENCOUNTER — Other Ambulatory Visit: Payer: Self-pay

## 2020-04-14 ENCOUNTER — Other Ambulatory Visit: Payer: Self-pay

## 2020-04-14 ENCOUNTER — Ambulatory Visit: Payer: Medicaid Other | Admitting: Family Medicine

## 2020-04-14 DIAGNOSIS — Z5321 Procedure and treatment not carried out due to patient leaving prior to being seen by health care provider: Secondary | ICD-10-CM

## 2020-04-14 NOTE — Progress Notes (Signed)
Client left before being seen by clinician.  She will reschedule.

## 2020-04-23 ENCOUNTER — Other Ambulatory Visit: Payer: Self-pay

## 2020-04-23 ENCOUNTER — Encounter: Payer: Self-pay | Admitting: Advanced Practice Midwife

## 2020-04-23 ENCOUNTER — Ambulatory Visit: Payer: Self-pay | Admitting: Advanced Practice Midwife

## 2020-04-23 DIAGNOSIS — Z113 Encounter for screening for infections with a predominantly sexual mode of transmission: Secondary | ICD-10-CM

## 2020-04-23 DIAGNOSIS — A599 Trichomoniasis, unspecified: Secondary | ICD-10-CM

## 2020-04-23 DIAGNOSIS — F1290 Cannabis use, unspecified, uncomplicated: Secondary | ICD-10-CM | POA: Insufficient documentation

## 2020-04-23 DIAGNOSIS — A5131 Condyloma latum: Secondary | ICD-10-CM | POA: Insufficient documentation

## 2020-04-23 DIAGNOSIS — F17200 Nicotine dependence, unspecified, uncomplicated: Secondary | ICD-10-CM

## 2020-04-23 DIAGNOSIS — Z202 Contact with and (suspected) exposure to infections with a predominantly sexual mode of transmission: Secondary | ICD-10-CM | POA: Insufficient documentation

## 2020-04-23 LAB — WET PREP FOR TRICH, YEAST, CLUE
Trichomonas Exam: POSITIVE — AB
Yeast Exam: NEGATIVE

## 2020-04-23 MED ORDER — METRONIDAZOLE 500 MG PO TABS: 500.0000 mg | ORAL_TABLET | Freq: Two times a day (BID) | ORAL | 0 refills | Status: AC

## 2020-04-23 MED ORDER — PENICILLIN G BENZATHINE 1200000 UNIT/2ML IM SUSP
2.4000 10*6.[IU] | Freq: Once | INTRAMUSCULAR | Status: AC
  Administered 2020-04-23: 2.4 10*6.[IU] via INTRAMUSCULAR

## 2020-04-23 NOTE — Progress Notes (Signed)
Wet mount reviewed, patient treated for trich per SO. Bicillin administered per provider orders. Patient scheduled to return to see provider in 1 week.Burt Knack, RN

## 2020-04-23 NOTE — Progress Notes (Signed)
Patient here for STD testing, c/o bumps "down there".Burt Knack, RN

## 2020-04-23 NOTE — Progress Notes (Signed)
[facility] Department STI clinic/screening visit  Subjective:  Leslie Gilbert is a 24 y.o. SBF G4P0 smoker female being seen today for an STI screening visit. The patient reports they do have symptoms.  Patient reports that they do not desire a pregnancy in the next year.   They reported they are not interested in discussing contraception today.  Patient's last menstrual period was 04/05/2020 (exact date).   Patient has the following medical conditions:   Patient Active Problem List   Diagnosis Date Noted  . 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 Marijuana use 04/23/2020  . Smoker 1/2-1 ppd 04/23/2020  . 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 Exposure to STD    HPI  Patient reports was here 04/14/20 and left before being seen.  States she is a contact to syphylis.  Also c/o "bumps" on her "inner lip" onset 04/17/20. Last sex 04/13/20 without condom; with current partner off and on x 4-5 years.  LMP 04/05/20.  Last MJ 02/2020.  Last ETOH 04/09/20 (6 beers) 1x/mo Smoking 1/2-1 ppd  Last HIV test per patient/review of record was 01/02/17 Patient reports last pap was pt unsure  See flowsheet for further details and programmatic requirements.    The following portions of the patient's history were reviewed and updated as appropriate: allergies, current medications, past medical history, past social history, past surgical history and problem list.  Objective:  There were no vitals filed for this visit.  Physical Exam Vitals and nursing note reviewed.  Constitutional:      Appearance: Normal appearance. She is normal weight.  HENT:     Head: Normocephalic and atraumatic.     Mouth/Throat:     Mouth: Mucous membranes are moist.     Pharynx: Oropharynx is clear. No oropharyngeal exudate or posterior oropharyngeal erythema.  Eyes:     Conjunctiva/sclera: Conjunctivae normal.  Pulmonary:     Effort: Pulmonary effort is normal.  Abdominal:       Palpations: Abdomen is soft. There is no mass.     Tenderness: There is no abdominal tenderness. There is no rebound.     Comments: Soft without masses or tenderness, fair tone  Genitourinary:    Exam position: Lithotomy position.     Pubic Area: No rash or pubic lice.      Labia:        Right: No rash or lesion.        Left: No rash or lesion.      Vagina: Normal. No vaginal discharge (large amt liquid yellow frothy malodourous leukorrhea, ph>4.5), erythema, bleeding or lesions.     Cervix: No cervical motion tenderness, discharge, friability, lesion or erythema.     Uterus: Normal.      Adnexa: Right adnexa normal and left adnexa normal.     Rectum: Normal.       Comments: Condyloma lata on left labia majora along 3/4 of labia, tender, wet appearing, with white mass of same on left anterior aspect of labia Lymphadenopathy:     Head:     Right side of head: No preauricular or posterior auricular adenopathy.     Left side of head: No preauricular or posterior auricular adenopathy.     Cervical: No cervical adenopathy.     Upper Body:     Right upper body: No supraclavicular or axillary adenopathy.     Left upper body: No supraclavicular or axillary adenopathy.     Lower Body:  No right inguinal adenopathy. No left inguinal adenopathy.  Skin:    General: Skin is warm and dry.     Findings: No rash.  Neurological:     Mental Status: She is alert and oriented to person, place, and time.      Assessment and Plan:  Leslie Gilbert is a 24 y.o. female presenting to the [facility] Department for STI screening  1. Screening examination for venereal disease Treat wet mount per standing orders Immunization nurse consult - WET PREP FOR TRICH, YEAST, CLUE - Chlamydia/Gonorrhea [place] [facility] - HIV/HCV [place] [facility] - HBV Antigen/Antibody [facility] - Syphilis Serology, [place] [facility] - Gonococcus culture  2. Syphilis contact Tx as contact to syphilis please per standing  orders Needs literature on syphilis RTC 1 week to f/u condyloma   3. Marijuana use   4. Smoker 1/2-1 ppd Counseled via 5 A's to stop smoking  5. Condylomata lata      Return if symptoms worsen or fail to improve.  No future appointments.  Alberteen Spindle, CNM

## 2020-04-23 NOTE — Addendum Note (Signed)
Addended by: Burt Knack on: 04/23/2020 02:16 PM   Modules accepted: Orders

## 2020-04-28 LAB — GONOCOCCUS CULTURE

## 2020-04-30 ENCOUNTER — Telehealth: Payer: Self-pay

## 2020-04-30 ENCOUNTER — Ambulatory Visit: Payer: Self-pay

## 2020-08-26 NOTE — Telephone Encounter (Signed)
Closed to f/u Richmond Campbell, RN

## 2020-10-14 ENCOUNTER — Ambulatory Visit: Payer: Self-pay

## 2021-06-07 IMAGING — DX DG KNEE COMPLETE 4+V*L*
4 series · 4 of 4 positions shown · non-contrast
Comparison: None.

CLINICAL DATA: Red swollen knee

EXAM:
LEFT KNEE - COMPLETE 4+ VIEW

[knee ap]
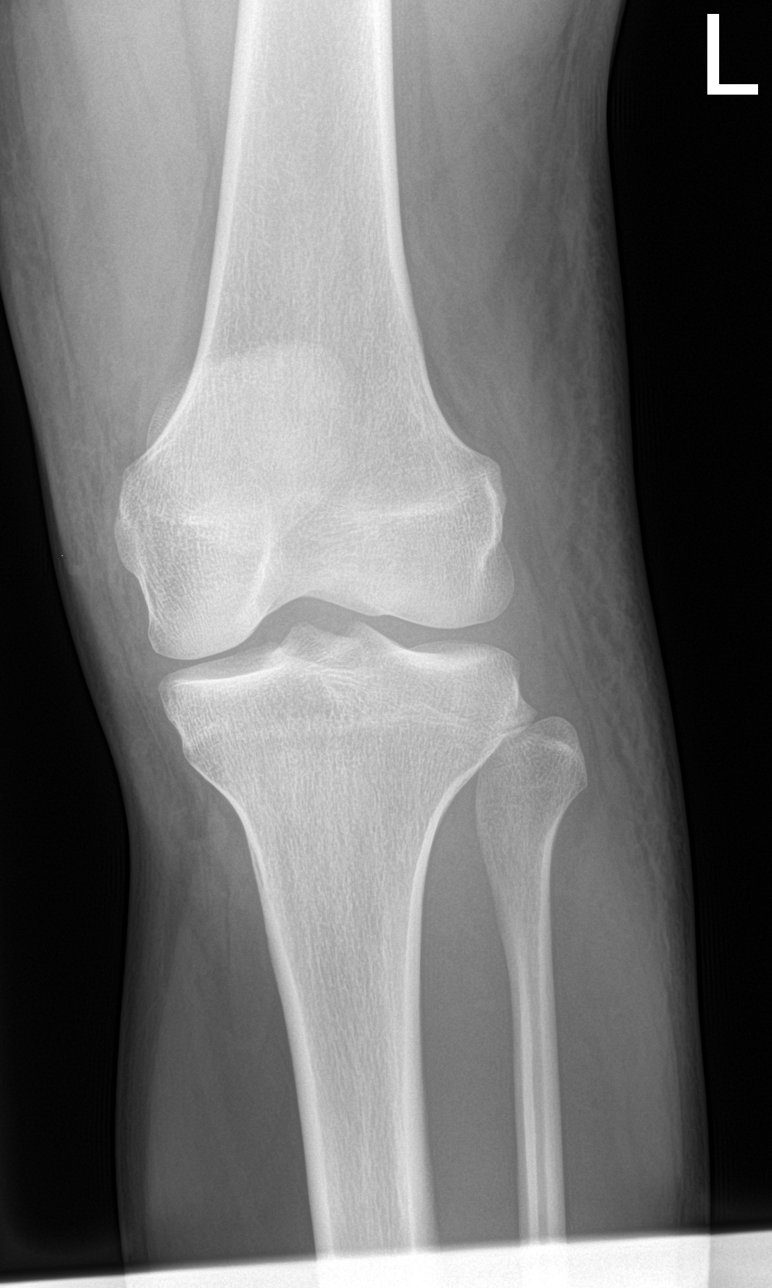

[knee lat]
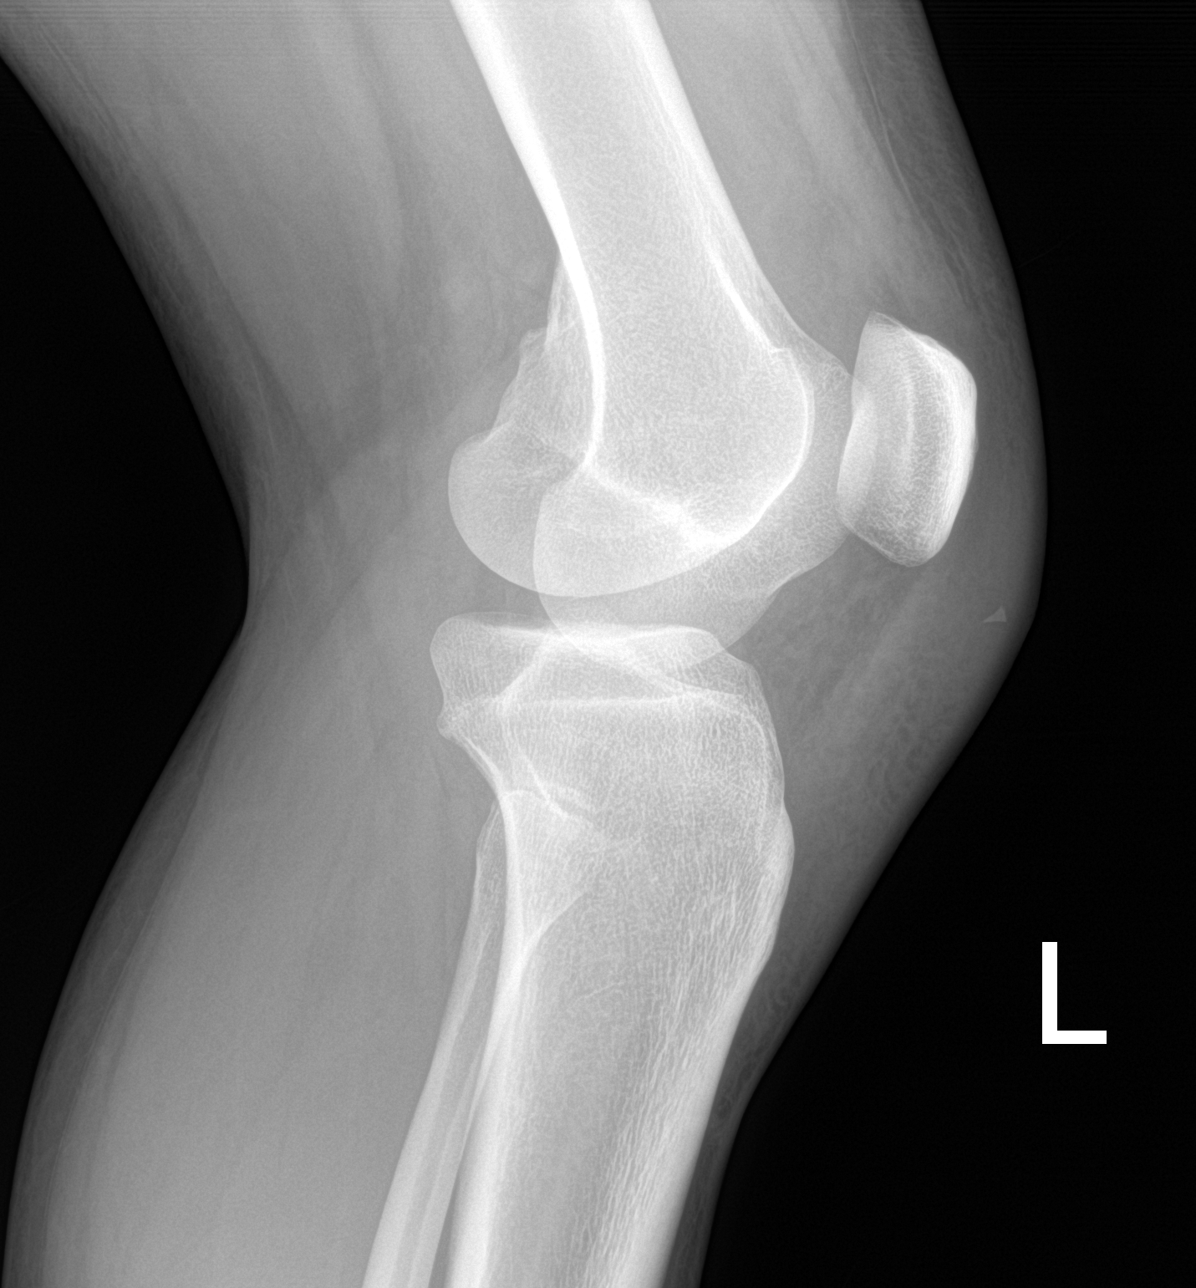

[knee obl (1 of 2)]
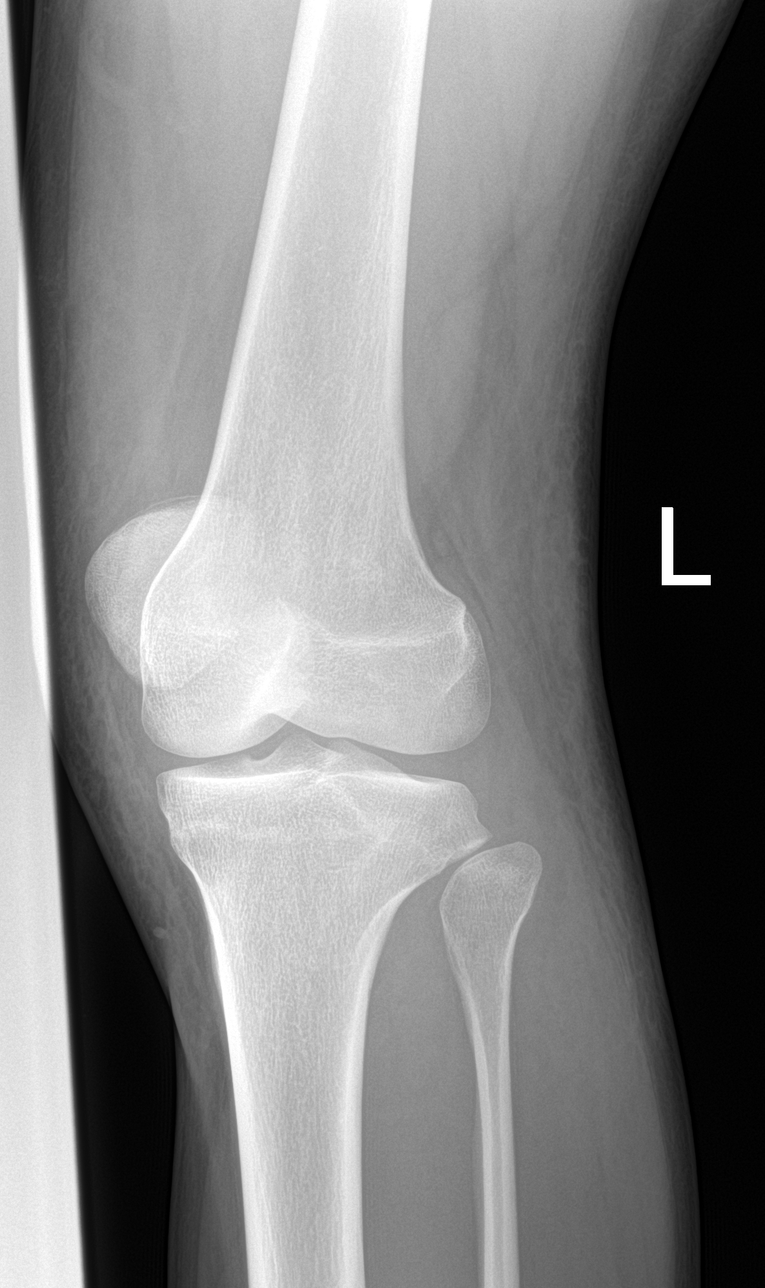

[knee obl (2 of 2)]
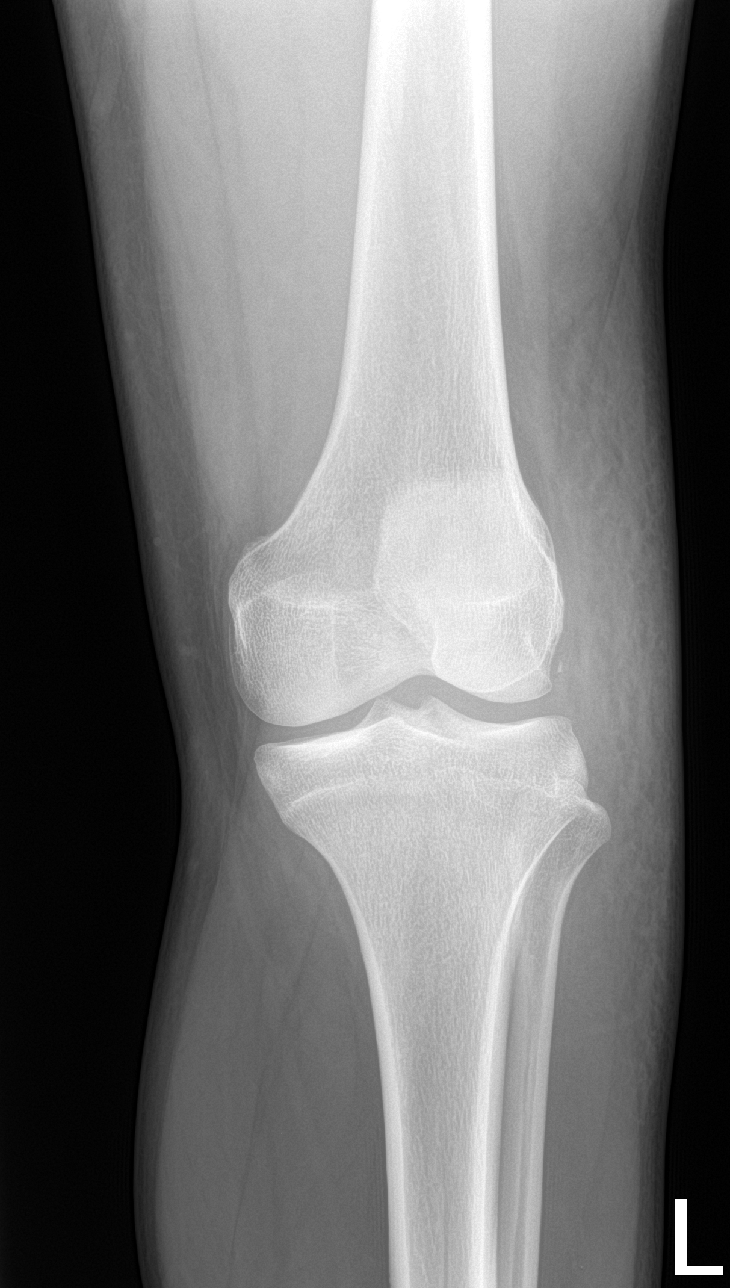

[4 of 4 positions shown; findings below may reference images not displayed]

FINDINGS: No fracture or dislocation. There is a 4 mm triangular-shaped
foreign body seen overlying the infrapatellar region. There is
diffuse prepatellar subcutaneous edema and soft tissue swelling. A
small knee joint effusion is seen.
IMPRESSION: 4 mm radiopaque density seen within the infrapatellar tissues with
diffuse prepatellar soft tissue swelling.

Small knee joint effusion.

## 2022-08-01 ENCOUNTER — Ambulatory Visit: Payer: Medicaid Other

## 2022-08-02 ENCOUNTER — Ambulatory Visit: Payer: Medicaid Other

## 2022-08-11 ENCOUNTER — Ambulatory Visit: Payer: Medicaid Other

## 2022-11-15 ENCOUNTER — Ambulatory Visit: Payer: 59

## 2023-01-18 ENCOUNTER — Ambulatory Visit: Payer: Medicaid Other | Admitting: Family Medicine

## 2023-01-23 ENCOUNTER — Encounter: Payer: Self-pay | Admitting: Family Medicine

## 2023-01-23 ENCOUNTER — Ambulatory Visit: Payer: Medicaid Other | Admitting: Family Medicine

## 2023-01-23 DIAGNOSIS — N760 Acute vaginitis: Secondary | ICD-10-CM

## 2023-01-23 DIAGNOSIS — A539 Syphilis, unspecified: Secondary | ICD-10-CM

## 2023-01-23 DIAGNOSIS — Z113 Encounter for screening for infections with a predominantly sexual mode of transmission: Secondary | ICD-10-CM

## 2023-01-23 LAB — PREGNANCY, URINE: Preg Test, Ur: NEGATIVE

## 2023-01-23 LAB — HM HIV SCREENING LAB: HM HIV Screening: NEGATIVE

## 2023-01-23 LAB — WET PREP FOR TRICH, YEAST, CLUE
Trichomonas Exam: NEGATIVE
Yeast Exam: NEGATIVE

## 2023-01-23 LAB — HEPATITIS B SURFACE ANTIGEN

## 2023-01-23 LAB — HM HEPATITIS C SCREENING LAB: HM Hepatitis Screen: NEGATIVE

## 2023-01-23 MED ORDER — METRONIDAZOLE 500 MG PO TABS: 500.0000 mg | ORAL_TABLET | Freq: Two times a day (BID) | ORAL | Status: AC

## 2023-01-23 MED ORDER — PENICILLIN G BENZATHINE 1200000 UNIT/2ML IM SUSY
2.4000 10*6.[IU] | PREFILLED_SYRINGE | INTRAMUSCULAR | Status: AC
  Administered 2023-01-23 – 2023-02-07 (×3): 2.4 10*6.[IU] via INTRAMUSCULAR

## 2023-01-23 NOTE — Progress Notes (Signed)
[facility] Department  STI clinic/screening visit [address] [place] [address] [phone]  Subjective:  Leslie Gilbert is a 27 y.o. female being seen today for an STI screening visit. The patient reports they do not have symptoms.  Patient reports that they do not desire a pregnancy in the next year.   They reported they are not interested in discussing contraception today.    No LMP recorded.  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 Practitioner oversight   Last HIV test per patient/review of record was  Lab Results  Component Value Date   HMHIVSCREEN Negative - Validated 01/02/2017   No results found for: "HIV" Patient reports last pap was No results found for: "DIAGPAP" No results found for: "SPECADGYN"  Screening for MPX risk: Does the patient have an unexplained rash? No Is the patient MSM? No Does the patient endorse multiple sex partners or anonymous sex partners? Yes Did the patient have close or sexual contact with a person diagnosed with MPX? No Has the patient traveled outside the [place] where MPX is endemic? No Is there a high clinical suspicion for MPX-- evidenced by one of the following No  -Unlikely to be chickenpox  -Lymphadenopathy  -Rash that present in same phase of evolution on any given body part See flowsheet for further details and programmatic requirements.   Immunization history:  Immunization History  Administered Date(s) Administered   HPV Quadrivalent 03/07/2007, 08/14/2007, 02/11/2008   Hepatitis A, Ped/Adol-2 Dose 01/18/2007, 08/14/2007    Hepatitis B, PED/ADOLESCENT 10/16/1994, 12/15/1994, 06/15/1995, 01/11/1996   Meningococcal Conjugate 10/19/1998, 03/07/2007   Tdap 07/10/2019   Varicella 10/06/1997, 01/18/2007     The following portions of the patient's history were reviewed and updated as appropriate: allergies, current medications, past medical history, past social history, past surgical history and problem list.  Objective:  There were no vitals filed for this visit.  Physical Exam Vitals and nursing note reviewed.  Constitutional:      Appearance: Normal appearance.  HENT:     Head: Normocephalic and atraumatic.     Mouth/Throat:     Mouth: Mucous membranes are moist.     Pharynx: Oropharynx is clear. No oropharyngeal exudate or posterior oropharyngeal erythema.  Pulmonary:     Effort: Pulmonary effort is normal.  Abdominal:     General: Abdomen is flat.     Palpations: There is no mass.     Tenderness: There is no abdominal tenderness. There is no rebound.  Genitourinary:    General: Normal vulva.     Exam position: Lithotomy position.     Pubic Area: No rash or pubic lice.      Labia:        Right: No rash or lesion.        Left: No rash or lesion.      Vagina: Vaginal discharge present. No erythema, bleeding or lesions.     Cervix: No cervical motion tenderness, discharge, friability, lesion or erythema.     Uterus: Normal.      Adnexa: Right adnexa normal and left adnexa normal.  Rectum: Normal.     Comments: pH = 4  Mild amt of white-yellow discharge present Lymphadenopathy:     Head:     Right side of head: No preauricular or posterior auricular adenopathy.     Left side of head: No preauricular or posterior auricular adenopathy.     Cervical: No cervical adenopathy.     Upper Body:     Right upper body: No supraclavicular, axillary or epitrochlear adenopathy.     Left upper body: No supraclavicular, axillary or epitrochlear adenopathy.     Lower Body: No right inguinal adenopathy. No  left inguinal adenopathy.  Skin:    General: Skin is warm and dry.     Findings: No rash.  Neurological:     Mental Status: She is alert and oriented to person, place, and time.      Assessment and Plan:  Leslie Gilbert is a 27 y.o. female presenting to the [facility] Department for STI screening  -pt given housing resources list today  1. Syphilis -04/23/20- Pt was a 1:256, treated with 1x bicillin -04/12/21- 1:2 in [place] -12/06/22- Pt was 1:128, given 7 days of Doxy- did not take all -Consulted [place] with DIS- will give 3 doses of bicillin  - penicillin g benzathine (BICILLIN LA) 1200000 UNIT/2ML injection 2.4 Million Units  2. Screening for venereal disease  - Pregnancy, urine - Chlamydia/Gonorrhea [place] [facility] - HIV [place] [facility] - Syphilis Serology, [place] [facility] - WET PREP FOR TRICH, YEAST, CLUE - HIV/HCV [place] [facility] - HBV Antigen/Antibody [facility]  3. Bacterial vaginitis  - metroNIDAZOLE (FLAGYL) 500 MG tablet; Take 1 tablet (500 mg total) by mouth 2 (two) times daily for 7 days.    Patient accepted all screenings including , vaginal CT/GC and bloodwork for HIV/RPR, and wet prep. Patient meets criteria for HepB screening? Yes. Ordered? yes Patient meets criteria for HepC screening? Yes. Ordered? yes  Treat wet prep per standing order Discussed time line for State Lab results and that patient will be called with positive results and encouraged patient to call if she had not heard in 2 weeks.  Counseled to return or seek care for continued or worsening symptoms Recommended repeat testing in 3 months with positive results. Recommended condom use with all sex  Patient is currently using  nothing  to prevent pregnancy.    Return in about 1 week (around 01/30/2023) for Bicillin shot.  No future appointments. Total time spent 30 minutes  Lenice Llamas, [place]

## 2023-01-23 NOTE — Progress Notes (Signed)
Pt here for STD Screening/syphilis treatment. Pt monitored for 15 minutes following injection, no reactions. Needs 2 additional injections 1 week apart. Housing list given. Gaspar Garbe, RN

## 2023-01-29 NOTE — Addendum Note (Signed)
Addended by: Berdie Ogren on: 01/29/2023 10:42 AM   Modules accepted: Orders

## 2023-01-31 ENCOUNTER — Ambulatory Visit: Payer: Medicaid Other

## 2023-01-31 DIAGNOSIS — A539 Syphilis, unspecified: Secondary | ICD-10-CM

## 2023-01-31 NOTE — Progress Notes (Signed)
Pt is here for BIC #2. Pt stated that she did well with the last injection.   Bicillin 2.4 MU given IM.  Pt tolerated well. Pt refused to be monitored for 15 minutes.   Appointment already scheduled for 8/13 and pt verified the information.  Berdie Ogren, RN

## 2023-02-06 ENCOUNTER — Ambulatory Visit: Payer: Medicaid Other

## 2023-02-06 DIAGNOSIS — Z719 Counseling, unspecified: Secondary | ICD-10-CM

## 2023-02-06 NOTE — Progress Notes (Signed)
Presented to nurse clinic for STI treatment/Syphilis/ Bicillin #3. Received Bicillin #2 injection 01/31/23 and has only been 6 days post injection.  Contacted Justice Rocher, RN, STI coordinator and states per Aliene Altes FNP, pt needs to receive injection tomorrow.  Explained to pt and appt rescheduled for 02/07/23 at 10:30.  Cherlynn Polo, RN

## 2023-02-07 ENCOUNTER — Ambulatory Visit: Payer: Medicaid Other

## 2023-02-07 DIAGNOSIS — A539 Syphilis, unspecified: Secondary | ICD-10-CM | POA: Diagnosis not present

## 2023-02-07 DIAGNOSIS — Z719 Counseling, unspecified: Secondary | ICD-10-CM

## 2023-02-07 NOTE — Progress Notes (Signed)
In nurse clinic for syphilis tx / Bicillin #3. Patient states no problems from last week's injections.   Bicillin 2.4 mu given today per order by Aliene Altes, FNP. Tolerated injections well. Patient states she is unable to stay for 20 min observation after injections. RN counseled patient to seek immediate med attn/ go to ER/urgent care if signs of serious reaction. Patient counseled no sex x 14 days and  to return in 6 mo for repeat RPR.  Per her request, Hep B results explained. Questions answered and reports understanding. Jerel Shepherd, RN

## 2023-05-14 ENCOUNTER — Ambulatory Visit: Payer: Medicaid Other

## 2023-05-14 VITALS — BP 122/82 | Ht 65.0 in | Wt 122.0 lb

## 2023-05-14 DIAGNOSIS — Z309 Encounter for contraceptive management, unspecified: Secondary | ICD-10-CM

## 2023-05-14 DIAGNOSIS — Z3201 Encounter for pregnancy test, result positive: Secondary | ICD-10-CM

## 2023-05-14 LAB — PREGNANCY, URINE: Preg Test, Ur: POSITIVE — AB

## 2023-05-14 MED ORDER — PRENATAL 27-0.8 MG PO TABS: 1.0000 | ORAL_TABLET | Freq: Every day | ORAL | Status: AC

## 2023-05-14 NOTE — Progress Notes (Signed)
UPT positive. Plans prenatal care at [facility]. Positive preg packet given and reviewed.   Completed treatment for syphilis with Bicillin x 3.  Bicillin given  01/23/23, 01/31/2023, and 02/07/2023.   The patient was dispensed prenatal vitamins #100 today per SO Dr Lorrin Mais. I provided counseling today regarding the medication. We discussed the medication, the side effects and when to call clinic. Patient given the opportunity to ask questions. Questions answered.    Sent to clerk for new OB appt and presumptive elig. Medicaid/preg women. Jerel Shepherd, RN

## 2023-06-11 DIAGNOSIS — O98119 Syphilis complicating pregnancy, unspecified trimester: Secondary | ICD-10-CM | POA: Insufficient documentation

## 2023-06-11 DIAGNOSIS — Z3400 Encounter for supervision of normal first pregnancy, unspecified trimester: Secondary | ICD-10-CM | POA: Insufficient documentation

## 2023-06-11 DIAGNOSIS — Z3480 Encounter for supervision of other normal pregnancy, unspecified trimester: Secondary | ICD-10-CM | POA: Insufficient documentation

## 2023-06-11 DIAGNOSIS — O0930 Supervision of pregnancy with insufficient antenatal care, unspecified trimester: Secondary | ICD-10-CM | POA: Insufficient documentation

## 2023-06-13 ENCOUNTER — Encounter: Payer: Medicaid Other | Admitting: Family Medicine

## 2023-06-13 DIAGNOSIS — O98119 Syphilis complicating pregnancy, unspecified trimester: Secondary | ICD-10-CM

## 2023-06-13 DIAGNOSIS — O0930 Supervision of pregnancy with insufficient antenatal care, unspecified trimester: Secondary | ICD-10-CM

## 2023-06-13 DIAGNOSIS — Z3400 Encounter for supervision of normal first pregnancy, unspecified trimester: Secondary | ICD-10-CM

## 2023-06-13 NOTE — Progress Notes (Signed)
Erroneous - disregard °

## 2023-06-27 NOTE — L&D Delivery Note (Signed)
 Delivery Note   Leslie Gilbert is a 28 y.o. G2P0010 at [redacted]w[redacted]d Estimated Date of Delivery: 10/22/23  PRE-OPERATIVE DIAGNOSIS:  1) [redacted]w[redacted]d pregnancy.  2) Polysubstance use in pregnancy 3) late prenatal care 4) GBS unknown  POST-OPERATIVE DIAGNOSIS:  1) [redacted]w[redacted]d pregnancy s/p Vaginal, Spontaneous 2) Pre-eclampsia with severe features Same delivered  Delivery Type: Vaginal, Spontaneous   Delivery Anesthesia: None  Labor Complications:  pre-eclampsia with severe features based on blood pressure in initial postpartum period.    ESTIMATED BLOOD LOSS: 450 ml    FINDINGS:   1) female infant, Apgar scores of 8   at 1 minute and 8   at 5 minutes and a birthweight of 5#15oz.     SPECIMENS:   PLACENTA:   Appearance: Intact   Removal: Spontaneous;Pathology     Disposition:  Pathology   CORD BLOOD: collected for typing  DISPOSITION:  Infant left in stable condition in the delivery room, with L&D personnel and mother,  NARRATIVE SUMMARY: Labor course:  Leslie Gilbert is a G2P0010 at [redacted]w[redacted]d who presented to Labor & Delivery for labor management. Her initial cervical exam was 6/80/-1. Labor proceeded spontaneously, she had a premature urge to push & AROM was performed. An anterior lip was held back with one contraction and she was completely dilated at 1802. With excellent maternal pushing effort, she birthed a viable female infant at 32. There was not a nuchal cord. The shoulders were birthed without difficulty. The infant was placed skin-to-skin with mother. The cord was doubly clamped and cut when pulsations ceased. The placenta delivered spontaneously and was noted to be intact with a 3VC, to placenta for pathology. A perineal and vaginal examination was performed. Episiotomy/Lacerations: 1st degree;Labial-right Lacerations were hemostatic and therefore not repaired. After delivery blood pressures in recovery were found to be severe range. She was treated with IV labetalol & magnesium  sulfate initiated for seizure prophylaxis. Mother and baby were left in stable condition.   Leslie Gilbert, CNM 09/30/2023 7:06 PM

## 2023-07-13 ENCOUNTER — Ambulatory Visit (INDEPENDENT_AMBULATORY_CARE_PROVIDER_SITE_OTHER): Payer: Medicaid Other

## 2023-07-13 DIAGNOSIS — Z3689 Encounter for other specified antenatal screening: Secondary | ICD-10-CM

## 2023-07-13 DIAGNOSIS — Z3480 Encounter for supervision of other normal pregnancy, unspecified trimester: Secondary | ICD-10-CM

## 2023-07-13 NOTE — Patient Instructions (Signed)
 Second Trimester of Pregnancy  The second trimester of pregnancy is from week 13 through week 27. This is also called months 4 through 6 of pregnancy. This is often the time when you feel your best. During the second trimester: Morning sickness is less or has stopped. You may have more energy. You may feel hungry more often. At this time, your unborn baby (fetus) is growing very fast. At the end of the sixth month, the unborn baby may be up to 12 inches long and weigh about 1 pounds. You will likely start to feel the baby move between 16 and 20 weeks of pregnancy. Body changes during your second trimester Your body continues to go through many changes during this time. The changes vary and generally return to normal after the baby is born. Physical changes You will gain more weight. You may start to get stretch marks on your hips, belly (abdomen), and breasts. Your breasts will grow and may hurt. Dark spots or blotches may develop on your face. A dark line from your belly button to the pubic area (linea nigra) may appear. You may have changes in your hair. Health changes You may have headaches. You may have heartburn. You may have trouble pooping (constipation). You may have hemorrhoids or swollen, bulging veins (varicose veins). Your gums may bleed. You may pee (urinate) more often. You may have back pain. Follow these instructions at home: Medicines Take over-the-counter and prescription medicines only as told by your doctor. Some medicines are not safe during pregnancy. Take a prenatal vitamin that contains at least 600 micrograms (mcg) of folic acid. Eating and drinking Eat healthy meals that include: Fresh fruits and vegetables. Whole grains. Good sources of protein, such as meat, eggs, or tofu. Low-fat dairy products. Avoid raw meat and unpasteurized juice, milk, and cheese. You may need to take these actions to prevent or treat trouble pooping: Drink enough fluids to keep  your pee (urine) pale yellow. Eat foods that are high in fiber. These include beans, whole grains, and fresh fruits and vegetables. Limit foods that are high in fat and sugar. These include fried or sweet foods. Activity Exercise only as told by your doctor. Most people can do their usual exercise during pregnancy. Try to exercise for 30 minutes at least 5 days a week. Stop exercising if you have pain or cramps in your belly or lower back. Do not exercise if it is too hot or too humid, or if you are in a place of great height (high altitude). Avoid heavy lifting. If you choose to, you may have sex unless your doctor tells you not to. Relieving pain and discomfort Wear a good support bra if your breasts are sore. Take warm water baths (sitz baths) to soothe pain or discomfort caused by hemorrhoids. Use hemorrhoid cream if your doctor approves. Rest with your legs raised (elevated) if you have leg cramps or low back pain. If you develop bulging veins in your legs: Wear support hose as told by your doctor. Raise your feet for 15 minutes, 3-4 times a day. Limit salt in your food. Safety Wear your seat belt at all times when you are in a car. Talk with your doctor if someone is hurting you or yelling at you a lot. Lifestyle Do not use hot tubs, steam rooms, or saunas. Do not douche. Do not use tampons or scented sanitary pads. Avoid cat litter boxes and soil used by cats. These carry germs that can harm your baby  and can cause a loss of your baby by miscarriage or stillbirth. Do not use herbal medicines, illegal drugs, or medicines that are not approved by your doctor. Do not drink alcohol. Do not smoke or use any products that contain nicotine or tobacco. If you need help quitting, ask your doctor. General instructions Keep all follow-up visits. This is important. Ask your doctor about local prenatal classes. Ask your doctor about the right foods to eat or for help finding a  counselor. Where to find more information American Pregnancy Association: americanpregnancy.org [employer]: www.acog.org Office on [employer] Health: Might[employer]Rew[employer].nz Contact a doctor if: You have a headache that does not go away when you take medicine. You have changes in how you see, or you see spots in front of your eyes. You have mild cramps, pressure, or pain in your lower belly. You continue to feel like you may vomit (nauseous), you vomit, or you have watery poop (diarrhea). You have bad-smelling fluid coming from your vagina. You have pain when you pee or your pee smells bad. You have very bad swelling of your face, hands, ankles, feet, or legs. You have a fever. Get help right away if: You are leaking fluid from your vagina. You have spotting or bleeding from your vagina. You have very bad belly cramping or pain. You have trouble breathing. You have chest pain. You faint. You have not felt your baby move for the time period told by your doctor. You have new or increased pain, swelling, or redness in an arm or leg. Summary The second trimester of pregnancy is from week 13 through week 27 (months 4 through 6). Eat healthy meals. Exercise as told by your doctor. Most people can do their usual exercise during pregnancy. Do not use herbal medicines, illegal drugs, or medicines that are not approved by your doctor. Do not drink alcohol. Call your doctor if you get sick or if you notice anything unusual about your pregnancy. This information is not intended to replace advice given to you by your health care provider. Make sure you discuss any questions you have with your health care provider. Document Revised: 11/19/2019 Document Reviewed: 09/25/2019 Elsevier Patient Education  2024 Elsevier Inc. Commonly Asked Questions During Pregnancy  Cats: A parasite can be excreted in cat feces.  To avoid exposure you need to have another  person empty the little box.  If you must empty the litter box you will need to wear gloves.  Wash your hands after handling your cat.  This parasite can also be found in raw or undercooked meat so this should also be avoided.  Colds, Sore Throats, Flu: Please check your medication sheet to see what you can take for symptoms.  If your symptoms are unrelieved by these medications please call the office.  Dental Work: Most any dental work Agricultural consultant recommends is permitted.  X-rays should only be taken during the first trimester if absolutely necessary.  Your abdomen should be shielded with a lead apron during all x-rays.  Please notify your provider prior to receiving any x-rays.  Novocaine is fine; gas is not recommended.  If your dentist requires a note from [place] prior to dental work please call the office and we will provide one for you.  Exercise: Exercise is an important part of staying healthy during your pregnancy.  You may continue most exercises you were accustomed to prior to pregnancy.  Later in your pregnancy you will most likely notice you  have difficulty with activities requiring balance like riding a bicycle.  It is important that you listen to your body and avoid activities that put you at a higher risk of falling.  Adequate rest and staying well hydrated are a must!  If you have questions about the safety of specific activities ask your provider.    Exposure to Children with illness: Try to avoid obvious exposure; report any symptoms to [place] when noted,  If you have chicken pos, red measles or mumps, you should be immune to these diseases.   Please do not take any vaccines while pregnant unless you have checked with your OB provider.  Fetal Movement: After 28 weeks we recommend you do "kick counts" twice daily.  Lie or sit down in a calm quiet environment and count your baby movements "kicks".  You should feel your baby at least 10 times per hour.  If you have not felt 10 kicks within the  first hour get up, walk around and have something sweet to eat or drink then repeat for an additional hour.  If count remains less than 10 per hour notify your provider.  Fumigating: Follow your pest control agent's advice as to how long to stay out of your home.  Ventilate the area well before re-entering.  Hemorrhoids:   Most over-the-counter preparations can be used during pregnancy.  Check your medication to see what is safe to use.  It is important to use a stool softener or fiber in your diet and to drink lots of liquids.  If hemorrhoids seem to be getting worse please call the office.   Hot Tubs:  Hot tubs Jacuzzis and saunas are not recommended while pregnant.  These increase your internal body temperature and should be avoided.  Intercourse:  Sexual intercourse is safe during pregnancy as long as you are comfortable, unless otherwise advised by your provider.  Spotting may occur after intercourse; report any bright red bleeding that is heavier than spotting.  Labor:  If you know that you are in labor, please go to the hospital.  If you are unsure, please call the office and let us help you decide what to do.  Lifting, straining, etc:  If your job requires heavy lifting or straining please check with your provider for any limitations.  Generally, you should not lift items heavier than that you can lift simply with your hands and arms (no back muscles)  Painting:  Paint fumes do not harm your pregnancy, but may make you ill and should be avoided if possible.  Latex or water based paints have less odor than oils.  Use adequate ventilation while painting.  Permanents & Hair Color:  Chemicals in hair dyes are not recommended as they cause increase hair dryness which can increase hair loss during pregnancy.  " Highlighting" and permanents are allowed.  Dye may be absorbed differently and permanents may not hold as well during pregnancy.  Sunbathing:  Use a sunscreen, as skin burns easily during  pregnancy.  Drink plenty of fluids; avoid over heating.  Tanning Beds:  Because their possible side effects are still unknown, tanning beds are not recommended.  Ultrasound Scans:  Routine ultrasounds are performed at approximately 20 weeks.  You will be able to see your baby's general anatomy an if you would like to know the gender this can usually be determined as well.  If it is questionable when you conceived you may also receive an ultrasound early in your pregnancy for dating purposes.  Otherwise ultrasound exams are not routinely performed unless there is a medical necessity.  Although you can request a scan we ask that you pay for it when conducted because insurance does not cover " patient request" scans.  Work: If your pregnancy proceeds without complications you may work until your due date, unless your physician or employer advises otherwise.  Round Ligament Pain/Pelvic Discomfort:  Sharp, shooting pains not associated with bleeding are fairly common, usually occurring in the second trimester of pregnancy.  They tend to be worse when standing up or when you remain standing for long periods of time.  These are the result of pressure of certain pelvic ligaments called "round ligaments".  Rest, Tylenol and heat seem to be the most effective relief.  As the womb and fetus grow, they rise out of the pelvis and the discomfort improves.  Please notify the office if your pain seems different than that described.  It may represent a more serious condition.  Common Medications Safe in Pregnancy  Acne:      Constipation:  Benzoyl Peroxide     Colace  Clindamycin      Dulcolax Suppository  Topica Erythromycin     Fibercon  Salicylic Acid      Metamucil         Miralax AVOID:        Senakot   Accutane    Cough:  Retin-A       Cough Drops  Tetracycline      Phenergan w/ Codeine if Rx  Minocycline      Robitussin (Plain &  DM)  Antibiotics:     Crabs/Lice:  Ceclor       RID  Cephalosporins    AVOID:  E-Mycins      Kwell  Keflex  Macrobid/Macrodantin   Diarrhea:  Penicillin      Kao-Pectate  Zithromax      Imodium AD         PUSH FLUIDS AVOID:       Cipro     Fever:  Tetracycline      Tylenol (Regular or Extra  Minocycline       Strength)  Levaquin      Extra Strength-Do not          Exceed 8 tabs/24 hrs Caffeine:        200mg /day (equiv. To 1 cup of coffee or  approx. 3 12 oz sodas)         Gas: Cold/Hayfever:       Gas-X  Benadryl      Mylicon  Claritin       Phazyme  **Claritin-D        Chlor-Trimeton    Headaches:  Dimetapp      ASA-Free Excedrin  Drixoral-Non-Drowsy     Cold Compress  Mucinex (Guaifenasin)     Tylenol (Regular or Extra  Sudafed/Sudafed-12 Hour     Strength)  **Sudafed PE Pseudoephedrine   Tylenol Cold & Sinus     Vicks Vapor Rub  Zyrtec  **AVOID if Problems With Blood Pressure         Heartburn: Avoid lying down for at least 1 hour after meals  Aciphex      Maalox     Rash:  Milk of Magnesia     Benadryl    Mylanta       1% Hydrocortisone Cream  Pepcid  Pepcid Complete   Sleep Aids:  Prevacid      Ambien   Prilosec  Benadryl  Rolaids       Chamomile Tea  Tums (Limit 4/day)     Unisom         Tylenol PM         Warm milk-add vanilla or  Hemorrhoids:       Sugar for taste  Anusol/Anusol H.C.  (RX: Analapram 2.5%)  Sugar Substitutes:  Hydrocortisone OTC     Ok in moderation  Preparation H      Tucks        Vaseline lotion applied to tissue with wiping    Herpes:     Throat:  Acyclovir      Oragel  Famvir  Valtrex     Vaccines:         Flu Shot Leg Cramps:       *Gardasil  Benadryl      Hepatitis A         Hepatitis B Nasal Spray:       Pneumovax  Saline Nasal Spray     Polio Booster         Tetanus Nausea:       Tuberculosis test or PPD  Vitamin B6 25 mg TID   AVOID:    Dramamine      *Gardasil  Emetrol       Live Poliovirus  Ginger  Root 250 mg QID    MMR (measles, mumps &  High Complex Carbs @ Bedtime    rebella)  Sea Bands-Accupressure    Varicella (Chickenpox)  Unisom 1/2 tab TID     *No known complications           If received before Pain:         Known pregnancy;   Darvocet       Resume series after  Lortab        Delivery  Percocet    Yeast:   Tramadol      Femstat  Tylenol 3      Gyne-lotrimin  Ultram       Monistat  Vicodin           MISC:         All Sunscreens           Hair Coloring/highlights          Insect Repellant's          (Including DEET)         Mystic Tans

## 2023-07-13 NOTE — Progress Notes (Cosign Needed Addendum)
New OB Intake  I connected with  Leslie Gilbert on 07/13/23 at  2:15 PM EST by telephone Video Visit and verified that I am speaking with the correct person using two identifiers. Nurse is located at [facility] and pt is located in her vehicle.  I discussed the limitations, risks, security and privacy concerns of performing an evaluation and management service by telephone and the availability of in person appointments. I also discussed with the patient that there may be a patient responsible charge related to this service. The patient expressed understanding and agreed to proceed.  I explained I am completing New OB Intake today. We discussed her EDD of 10/22/2023 that is based on LMP of 01/15/2023, patient states that she had a miscarriage prior to last menstrual and states today that she is unsure of LMP. Pt is G2/P0. I reviewed her allergies, medications, Medical/Surgical/OB history, and appropriate screenings. There are cats in the home in the home  no . Based on history, this is a/an pregnancy uncomplicated . Her obstetrical history is significant for smoker.  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 U/S OB ultrsound >14 weeks ordered today patient give number to centralized scheduling to schedule appointment.   Labs Discussed Avelina Laine genetic screening with patient. Patient will get genetic testing to be drawn at new OB visit. Discussed possible labs to be drawn at new OB appointment.  COVID Vaccine Patient has had COVID vaccine.   Social Determinants of Health Food Insecurity:  expresses food insecurity. Information given on local food banks. WIC Referral: Patient is interested in referral to [facility]  Transportation: Patient expressed transportation needs. Childcare: Discussed no children allowed at ultrasound appointments.   First visit review I reviewed new OB appt with pt. I explained she will have ob bloodwork and pap smear/pelvic exam if indicated. Explained pt will be seen by Hartley Barefoot at first visit 07/18/2023; encounter routed to appropriate provider.   Fonda Kinder, CMA 07/13/2023  2:57 PM

## 2023-07-18 ENCOUNTER — Encounter: Payer: Self-pay | Admitting: Certified Nurse Midwife

## 2023-07-18 ENCOUNTER — Ambulatory Visit (INDEPENDENT_AMBULATORY_CARE_PROVIDER_SITE_OTHER): Payer: Medicaid Other | Admitting: Certified Nurse Midwife

## 2023-07-18 ENCOUNTER — Other Ambulatory Visit (HOSPITAL_COMMUNITY)
Admission: RE | Admit: 2023-07-18 | Discharge: 2023-07-18 | Disposition: A | Discharge status: Home / Self Care | Payer: Medicaid Other | Source: Ambulatory Visit | Attending: Certified Nurse Midwife | Admitting: Certified Nurse Midwife

## 2023-07-18 VITALS — BP 116/82 | HR 99 | Wt 135.0 lb

## 2023-07-18 DIAGNOSIS — Z3480 Encounter for supervision of other normal pregnancy, unspecified trimester: Secondary | ICD-10-CM | POA: Insufficient documentation

## 2023-07-18 DIAGNOSIS — Z1379 Encounter for other screening for genetic and chromosomal anomalies: Secondary | ICD-10-CM

## 2023-07-18 DIAGNOSIS — Z3482 Encounter for supervision of other normal pregnancy, second trimester: Secondary | ICD-10-CM

## 2023-07-18 DIAGNOSIS — Z3A26 26 weeks gestation of pregnancy: Secondary | ICD-10-CM | POA: Diagnosis not present

## 2023-07-18 DIAGNOSIS — O0930 Supervision of pregnancy with insufficient antenatal care, unspecified trimester: Secondary | ICD-10-CM

## 2023-07-18 DIAGNOSIS — Z113 Encounter for screening for infections with a predominantly sexual mode of transmission: Secondary | ICD-10-CM | POA: Insufficient documentation

## 2023-07-18 DIAGNOSIS — Z369 Encounter for antenatal screening, unspecified: Secondary | ICD-10-CM

## 2023-07-18 DIAGNOSIS — Z23 Encounter for immunization: Secondary | ICD-10-CM | POA: Diagnosis not present

## 2023-07-18 DIAGNOSIS — Z124 Encounter for screening for malignant neoplasm of cervix: Secondary | ICD-10-CM | POA: Diagnosis present

## 2023-07-18 DIAGNOSIS — F1290 Cannabis use, unspecified, uncomplicated: Secondary | ICD-10-CM

## 2023-07-18 DIAGNOSIS — Z114 Encounter for screening for human immunodeficiency virus [HIV]: Secondary | ICD-10-CM

## 2023-07-18 MED ORDER — PRENATAL VITAMIN 27-0.8 MG PO TABS: 1.0000 | ORAL_TABLET | Freq: Every day | ORAL | 3 refills | Status: DC

## 2023-07-18 NOTE — Progress Notes (Signed)
NEW OB HISTORY AND PHYSICAL  SUBJECTIVE:       Leslie Gilbert is a 28 y.o. G30P0010 female, Patient's last menstrual period was 01/15/2023 (within days)., Estimated Date of Delivery: 10/22/23, [redacted]w[redacted]d, presents today for establishment of Prenatal Care. She reports positive fetal movement, denies nausea, endorses nipple/breast tenderness.   She is unsure of LMP as she miscarried in July, reports around 10-12w EGA and has not had a cycle since. She was found to be pregnant on visit to [facility] 11/18 with +UPT. She was treated for syphilis in with Bicillin x3 doses at [facility] 7/30, 8/7, 8/14.  Social history Partner/Relationship: off/on relationship Living situation: staying with a friend right now Work: unemployed Exercise: not regular Substance use: ~4 cigarettes/day, every other day MJ use. Prior to pregnancy reports history of crack cocaine use.  Indications for ASA therapy (per uptodate) One of the following: Previous pregnancy with preeclampsia, especially early onset and with an adverse outcome No Multifetal gestation No Chronic hypertension No Type 1 or 2 diabetes mellitus No Chronic kidney disease No Autoimmune disease (antiphospholipid syndrome, systemic lupus erythematosus) No  Two or more of the following: Nulliparity Yes Obesity (body mass index >30 kg/m2) No Family history of preeclampsia in mother or sister No Age >=35 years No Sociodemographic characteristics (African American race, low socioeconomic level) Yes Personal risk factors (eg, previous pregnancy with low birth weight or small for gestational age infant, previous adverse pregnancy outcome [eg, stillbirth], interval >10 years between pregnancies) No   ASA not advised at she is estimated at >[redacted] weeks gestational age.  Gynecologic History Patient's last menstrual period was 01/15/2023 (within days).  This was a miscarriage, has not had a period Contraception: none Last Pap: unsure of date. Results were: reports  normal  Obstetric History OB History  Gravida Para Term Preterm AB Living  2 0 0 0 1 0  SAB IAB Ectopic Multiple Live Births  1 0 0 0 0    # Outcome Date GA Lbr Len/2nd Weight Sex Type Anes PTL Lv  2 Current           1 SAB             Past Medical History:  Diagnosis Date   Breast mass    Syphilis    Tx with Bicillin  x3 at [facility] / 01/2023    Past Surgical History:  Procedure Laterality Date   UMBILICAL HERNIA REPAIR     age 28 or 5    Current Outpatient Medications on File Prior to Visit  Medication Sig Dispense Refill   Prenatal Vit-Fe Fumarate-FA (MULTIVITAMIN-PRENATAL) 27-0.8 MG TABS tablet Take 1 tablet by mouth daily at 12 noon.     No current facility-administered medications on file prior to visit.    Allergies  Allergen Reactions   Acetaminophen    Hydrocodone    Asa [Aspirin] Swelling    Social History   Socioeconomic History   Marital status: Single    Spouse name: Not on file   Number of children: 0   Years of education: Not on file   Highest education level: Not on file  Occupational History   Not on file  Tobacco Use   Smoking status: Every Day    Current packs/day: 0.50    Average packs/day: 0.5 packs/day for 6.0 years (3.0 ttl pk-yrs)    Types: Cigarettes   Smokeless tobacco: Never  Vaping Use   Vaping status: Never Used  Substance and Sexual Activity  Alcohol use: Not Currently    Alcohol/week: 6.0 standard drinks of alcohol    Types: 6 Cans of beer per week    Comment: last use 2023   Drug use: Yes    Types: Marijuana    Comment: daily   Sexual activity: Yes    Partners: Male, Female    Birth control/protection: None    Comment: stopped depo 2018  Other Topics Concern   Not on file  Social History Narrative   Not on file   Social Drivers of Health   Financial Resource Strain: High Risk (07/13/2023)   Overall Financial Resource Strain (CARDIA)    Difficulty of Paying Living Expenses: Hard  Food Insecurity: No Food  Insecurity (07/13/2023)   Hunger Vital Sign    Worried About Running Out of Food in the Last Year: Never true    Ran Out of Food in the Last Year: Never true  Transportation Needs: Unmet Transportation Needs (07/13/2023)   PRAPARE - Transportation    Lack of Transportation (Medical): Yes    Lack of Transportation (Non-Medical): Yes  Physical Activity: Inactive (07/13/2023)   Exercise Vital Sign    Days of Exercise per Week: 0 days    Minutes of Exercise per Session: 0 min  Stress: No Stress Concern Present (07/13/2023)   [employer] of Occupational Health - Occupational Stress Questionnaire    Feeling of Stress : Only a little  Social Connections: Moderately Isolated (07/13/2023)   Social Connection and Isolation Panel [NHANES]    Frequency of Communication with Friends and Family: More than three times a week    Frequency of Social Gatherings with Friends and Family: Twice a week    Attends Religious Services: More than 4 times per year    Active Member of [employer] or Organizations: No    Attends Banker Meetings: Never    Marital Status: Divorced  Catering manager Violence: Not At Risk (07/13/2023)   Humiliation, Afraid, Rape, and Kick questionnaire    Fear of Current or Ex-Partner: No    Emotionally Abused: No    Physically Abused: No    Sexually Abused: No    Family History  Problem Relation Age of Onset   Breast cancer Mother    Breast cancer Other     The following portions of the patient's history were reviewed and updated as appropriate: allergies, current medications, past OB history, past medical history, past surgical history, past family history, past social history, and problem list.  Constitutional: Denied constitutional symptoms, night sweats, recent illness, fatigue, fever, insomnia and weight loss.  Eyes: Denied eye symptoms, eye pain, photophobia, vision change and visual disturbance.  Ears/Nose/Throat/Neck: Denied ear, nose, throat or neck  symptoms, hearing loss, nasal discharge, sinus congestion and sore throat.  Cardiovascular: Denied cardiovascular symptoms, arrhythmia, chest pain/pressure, edema, exercise intolerance, orthopnea and palpitations.  Respiratory: Denied pulmonary symptoms, asthma, pleuritic pain, productive sputum, cough, dyspnea and wheezing.  Gastrointestinal: Denied gastro-esophageal reflux, melena, nausea and vomiting.  Genitourinary: Denied genitourinary symptoms including symptomatic vaginal discharge, pelvic relaxation issues, and urinary complaints.  Musculoskeletal: Denied musculoskeletal symptoms, stiffness, swelling, muscle weakness and myalgia.  Dermatologic: Denied dermatology symptoms, rash and scar.  Neurologic: Denied neurology symptoms, dizziness, headache, neck pain and syncope.  Psychiatric: Denied psychiatric symptoms, anxiety and depression.  Endocrine: Denied endocrine symptoms including hot flashes and night sweats.     OBJECTIVE: Initial Physical Exam (New OB)  GENERAL APPEARANCE: alert, well appearing, in no apparent distress, oriented to person, place  and time HEAD: normocephalic, atraumatic BREASTS: no masses noted, no significant tenderness, no palpable axillary nodes, no skin changes LUNGS: clear to auscultation, no wheezes, rales or rhonchi, symmetric air entry HEART: regular rate and rhythm, no murmurs ABDOMEN: soft, nontender, nondistended, no abnormal masses, no epigastric pain, gravid SKIN: normal coloration and turgor, no rashes NEUROLOGIC: alert, oriented, normal speech, no focal findings or movement disorder noted  PELVIC EXAM EXTERNAL GENITALIA: normal appearing vulva with no masses, tenderness or lesions VAGINA: no abnormal discharge or lesions CERVIX: pap collected, friability present, visually closed FHR: 140, Fundal height 25cm  ASSESSMENT: Normal pregnancy   PLAN: Routine prenatal care. We discussed an overview of prenatal care and when to call. Reviewed  diet, exercise, and weight gain recommendations in pregnancy. She desires to bottle feed. I reviewed labs and answered all questions.  1. Supervision of other normal pregnancy, antepartum (Primary) - NOB Panel; Future - Culture, OB Urine - Monitor Drug Profile 14(MW) - Nicotine screen, urine - Urinalysis, Routine w reflex microscopic - Comprehensive metabolic panel - Hemoglobin A1c - Hgb Fractionation Cascade - TSH + free T4 - MaterniT 21 plus Core, Blood - Cervicovaginal ancillary only - Cytology - PAP - NOB Panel  2. Antenatal screening encounter - NOB Panel; Future - Culture, OB Urine - Monitor Drug Profile 14(MW) - Nicotine screen, urine - Urinalysis, Routine w reflex microscopic - Comprehensive metabolic panel - Hemoglobin A1c - Hgb Fractionation Cascade - TSH + free T4 - MaterniT 21 plus Core, Blood - Cervicovaginal ancillary only - Cytology - PAP - NOB Panel  3. Genetic screening - MaterniT 21 plus Core, Blood  4. Screening for human immunodeficiency virus - NOB Panel; Future - NOB Panel  5. Screen for sexually transmitted diseases - NOB Panel; Future - Cervicovaginal ancillary only - NOB Panel  6. Cervical cancer screening - Cytology - PAP  7. Encounter for immunization - Flu vaccine trivalent PF, 6mos and older(Flulaval,Afluria,Fluarix,Fluzone)  8. Marijuana use during pregnancy  -Recommended smoking & marijuana cessation, declines nicotine patch has decreased smoking significantly now only ~4/d. Reports MJ use is to help with stressors in her life, declines counseling referral today. Referral to [facility] pregnancy case management made. Dominica Severin, CNM

## 2023-07-18 NOTE — Patient Instructions (Addendum)
Second Trimester of Pregnancy  The second trimester of pregnancy is from week 14 through week 27. This is months 4 through 6 of pregnancy. During the second trimester: Morning sickness is less or has stopped. You may have more energy. You may feel hungry more often. At this time, your unborn baby is growing very fast. At the end of the sixth month, the unborn baby may be up to 12 inches long and weigh about 1 pounds. You will likely start to feel the baby move between 16 and 20 weeks of pregnancy. Body changes during your second trimester Your body continues to change during this time. The changes usually go away after your baby is born. Physical changes You will gain more weight. Your belly will get bigger. You may begin to get stretch marks on your hips, belly, and breasts. Your breasts will keep growing and may hurt. You may get dark spots or blotches on your face. A dark line from your belly button to the pubic area may appear. This line is called linea nigra. Your hair may grow faster and get thicker. Health changes You may have headaches. You may have heartburn. You may pee more often. You may have swollen, bulging veins (varicose veins). You may have trouble pooping (constipation), or swollen veins in the butt that can itch or get painful (hemorrhoids). You may have back pain. This is caused by: Weight gain. Pregnancy hormones that are relaxing the joints in your pelvis. Follow these instructions at home: Medicines Talk to your health care provider if you're taking medicines. Ask if the medicines are safe to take during pregnancy. Your provider may change the medicines that you take. Do not take any medicines unless told to by your provider. Take a prenatal vitamin that has at least 600 micrograms (mcg) of folic acid. Do not use herbal medicines, illegal drugs, or medicines that are not approved by your provider. Eating and drinking While you're pregnant your body needs  extra food for your growing baby. Talk with your provider about what to eat while pregnant. Activity Most women are able to exercise during pregnancy. Exercises may need to change as your pregnancy goes on. Talk to your provider about your activities and exercise routines. Relieving pain and discomfort Wear a good, supportive bra if your breasts hurt. Rest with your legs raised if you have leg cramps or low back pain. Take warm sitz baths to soothe pain from hemorrhoids. Use hemorrhoid cream if your provider says it's okay. Do not douche. Do not use tampons or scented pads. Do not use hot tubs, steam rooms, or saunas. Safety Wear your seatbelt at all times when you're in a car. Talk to your provider if someone hits you, hurts you, or yells at you. Talk with your provider if you're feeling sad or have thoughts of hurting yourself. Lifestyle Certain things can be harmful while you're pregnant. It's best to avoid the following: Do not drink alcohol,smoke, vape, or use products with nicotine or tobacco in them. If you need help quitting, talk with your provider. Avoid cat litter boxes and soil used by cats. These things carry germs that can cause harm to your pregnancy and your baby. General instructions Keep all follow-up visits. It helps you and your unborn baby stay as healthy as possible. Write down your questions. Take them to your prenatal visits. Your provider will: Talk with you about your overall health. Give you advice or refer you to specialists who can help with different needs,  including: Prenatal education classes. Mental health and counseling. Foods and healthy eating. Ask for help if you need help with food. Where to find more information American Pregnancy Association: americanpregnancy.org [employer] of Obstetricians and Gynecologists: acog.org Office on [employer] Health: TravelLesson.ca Contact a health care provider if: You have a headache that does not go away  when you take medicine. You have any of these problems: You can't eat or drink. You throw up or feel like you may throw up. You have watery poop (diarrhea) for 2 days or more. You have pain when you pee or your pee smells bad. You have been sick for 2 days or more and are not getting better. Contact your provider right away if: You have any of these coming from your vagina: Abnormal discharge. Bad-smelling fluid. Bleeding. Your baby is moving less than usual. You have contractions, belly cramping, or have pain in your pelvis or lower back. You have symptoms of high blood pressure or preeclampsia. These include: A severe, throbbing headache that does not go away. Sudden or extreme swelling of your face, hands, legs, or feet. Vision problems: You see spots. You have blurry vision. Your eyes are sensitive to light. If you can't reach the provider, go to an urgent care or emergency room. Get help right away if: You faint, become confused, or can't think clearly. You have chest pain or trouble breathing. You have any kind of injury, such as from a fall or a car crash. These symptoms may be an emergency. Call 911 right away. Do not wait to see if the symptoms will go away. Do not drive yourself to the hospital. This information is not intended to replace advice given to you by your health care provider. Make sure you discuss any questions you have with your health care provider. Document Revised: 03/15/2023 Document Reviewed: 10/13/2022 Elsevier Patient Education  2024 [employer]. The Dangers of Smoking While Pregnant  Smoking while you're pregnant isn't good for you and your baby. The smoke from cigarettes, vapes, pipes, and cigars has chemicals in it that can cause cancer. The smoke also has a drug in it called nicotine. When you smoke, the chemicals and nicotine go into your body and can be passed on to your baby. This can affect your baby's growth. If you're pregnant or plan to  become pregnant, talk with your health care provider about how to stop smoking. You have a much better chance of having a healthy pregnancy and a healthy baby if you don't smoke while pregnant. How does smoking affect me? Smoking makes you more likely to get many long-term diseases. These include: Cancer. Lung diseases. Heart disease. Smoking while you're pregnant can make you more likely to: Lose the pregnancy. Have a pregnancy that's outside of the uterus. This is called a tubal pregnancy. Give birth too early. Have your water break early. Have problems with the placenta, which is the organ that gives your baby nutrients and oxygen. How does smoking affect my baby? Before birth If you smoke while you're pregnant, it can: Lessen blood flow and oxygen to your baby. Affect your baby's brain and lungs. Slow your baby's growth. After birth Babies exposed to smoke during pregnancy: May show signs of nicotine withdrawal. May be born with a cleft lip or cleft palate. May be born too small. May have a high risk of: Serious and lifelong health problems. Sudden infant death syndrome (SIDS). What can I do to lower my risk of problems from smoking? Do  not smoke, vape, or use nicotine or tobacco. Do not take nicotine supplements or medicine to help you quit smoking unless your provider tells you to. Stay away when people are smoking. Ask people who smoke to avoid smoking around you. Talk with your provider about ways to help you quit smoking. You may want to try: Counseling. Psychotherapy. Acupuncture. Hypnosis. Phone hotlines for people trying to quit. Where to find more information To learn more about smoking while pregnant and how to quit, go to: March of Dimes: marchofdimes.org U.S. Department of Health and Human Services [facility]): women.smokefree.gov American Cancer Society (ACS): cancer.org For help to quit smoking, call the national quitline at: 1-800-QUIT-NOW (847)105-9331). Contact a  health care provider if: You're having trouble quitting smoking. You smoke and are pregnant or plan to become pregnant. You start smoking again after you give birth. This information is not intended to replace advice given to you by your health care provider. Make sure you discuss any questions you have with your health care provider. Document Revised: 11/01/2022 Document Reviewed: 11/01/2022 Elsevier Patient Education  2024 [employer].

## 2023-07-19 ENCOUNTER — Ambulatory Visit
Admission: RE | Admit: 2023-07-19 | Discharge: 2023-07-19 | Disposition: A | Discharge status: Home / Self Care | Payer: Medicaid Other | Source: Ambulatory Visit | Attending: Certified Nurse Midwife | Admitting: Certified Nurse Midwife

## 2023-07-19 DIAGNOSIS — Z3A28 28 weeks gestation of pregnancy: Secondary | ICD-10-CM | POA: Insufficient documentation

## 2023-07-19 DIAGNOSIS — Z3480 Encounter for supervision of other normal pregnancy, unspecified trimester: Secondary | ICD-10-CM | POA: Diagnosis present

## 2023-07-19 DIAGNOSIS — Z3483 Encounter for supervision of other normal pregnancy, third trimester: Secondary | ICD-10-CM | POA: Diagnosis not present

## 2023-07-19 LAB — URINALYSIS, ROUTINE W REFLEX MICROSCOPIC
Bilirubin, UA: NEGATIVE
Glucose, UA: NEGATIVE
Ketones, UA: NEGATIVE
Nitrite, UA: NEGATIVE
RBC, UA: NEGATIVE
Specific Gravity, UA: 1.028 (ref 1.005–1.030)
Urobilinogen, Ur: 1 mg/dL (ref 0.2–1.0)
pH, UA: 6.5 (ref 5.0–7.5)

## 2023-07-19 LAB — CERVICOVAGINAL ANCILLARY ONLY
Bacterial Vaginitis (gardnerella): POSITIVE — AB
Candida Glabrata: NEGATIVE
Candida Vaginitis: POSITIVE — AB
Chlamydia: POSITIVE — AB
Comment: NEGATIVE
Comment: NEGATIVE
Comment: NEGATIVE
Comment: NEGATIVE
Comment: NEGATIVE
Comment: NORMAL
Neisseria Gonorrhea: NEGATIVE
Trichomonas: POSITIVE — AB

## 2023-07-19 LAB — MICROSCOPIC EXAMINATION
Bacteria, UA: NONE SEEN
Casts: NONE SEEN /[LPF]
Epithelial Cells (non renal): >10 /[HPF] — AB (ref 0–10)
RBC, Urine: NONE SEEN /[HPF] (ref 0–2)

## 2023-07-20 ENCOUNTER — Encounter: Payer: Self-pay | Admitting: Certified Nurse Midwife

## 2023-07-20 ENCOUNTER — Other Ambulatory Visit: Payer: Self-pay | Admitting: Certified Nurse Midwife

## 2023-07-20 DIAGNOSIS — A64 Unspecified sexually transmitted disease: Secondary | ICD-10-CM | POA: Insufficient documentation

## 2023-07-20 DIAGNOSIS — B9689 Other specified bacterial agents as the cause of diseases classified elsewhere: Secondary | ICD-10-CM

## 2023-07-20 DIAGNOSIS — B3731 Acute candidiasis of vulva and vagina: Secondary | ICD-10-CM

## 2023-07-20 LAB — CULTURE, OB URINE

## 2023-07-20 LAB — URINE CULTURE, OB REFLEX

## 2023-07-20 MED ORDER — METRONIDAZOLE 500 MG PO TABS: 500.0000 mg | ORAL_TABLET | Freq: Two times a day (BID) | ORAL | 0 refills | Status: AC

## 2023-07-20 MED ORDER — MICONAZOLE NITRATE 2 % VA CREA: 1.0000 | TOPICAL_CREAM | Freq: Every day | VAGINAL | 2 refills | Status: DC

## 2023-07-20 MED ORDER — AZITHROMYCIN 500 MG PO TABS: 1000.0000 mg | ORAL_TABLET | Freq: Once | ORAL | 0 refills | Status: AC

## 2023-07-22 LAB — MATERNIT 21 PLUS CORE, BLOOD
Fetal Fraction: 30
Result (T21): NEGATIVE
Trisomy 13 (Patau syndrome): NEGATIVE
Trisomy 18 (Edwards syndrome): NEGATIVE
Trisomy 21 (Down syndrome): NEGATIVE

## 2023-07-25 LAB — COCAINE (GC/MS), URINE
BENZOYLECGONINE (GC/MS): >5000 ng/mL
COCAINE + METABOLITE: POSITIVE — AB

## 2023-07-25 LAB — MONITOR DRUG PROFILE 14(MW)
Amphetamine Scrn, Ur: NEGATIVE ng/mL
BARBITURATE SCREEN URINE: NEGATIVE ng/mL
BENZODIAZEPINE SCREEN, URINE: NEGATIVE ng/mL
Buprenorphine, Urine: NEGATIVE ng/mL
Creatinine(Crt), U: 150.7 mg/dL (ref 20.0–300.0)
Fentanyl, Urine: NEGATIVE pg/mL
Meperidine Screen, Urine: NEGATIVE ng/mL
Methadone Screen, Urine: NEGATIVE ng/mL
OXYCODONE+OXYMORPHONE UR QL SCN: NEGATIVE ng/mL
Opiate Scrn, Ur: NEGATIVE ng/mL
Ph of Urine: 6.2 (ref 4.5–8.9)
Phencyclidine Qn, Ur: NEGATIVE ng/mL
Propoxyphene Scrn, Ur: NEGATIVE ng/mL
SPECIFIC GRAVITY: 1.022
Tramadol Screen, Urine: NEGATIVE ng/mL

## 2023-07-25 LAB — CYTOLOGY - PAP
Comment: NEGATIVE
Diagnosis: UNDETERMINED — AB
High risk HPV: POSITIVE — AB

## 2023-07-25 LAB — NICOTINE SCREEN, URINE: Cotinine Ql Scrn, Ur: POSITIVE ng/mL — AB

## 2023-07-25 LAB — CANNABINOID (GC/MS), URINE
Cannabinoid: POSITIVE — AB
Carboxy THC (GC/MS): 59 ng/mL

## 2023-07-27 ENCOUNTER — Ambulatory Visit: Payer: Medicaid Other

## 2023-07-27 ENCOUNTER — Encounter: Payer: Self-pay | Admitting: Certified Nurse Midwife

## 2023-07-27 ENCOUNTER — Telehealth: Payer: Self-pay | Admitting: Obstetrics and Gynecology

## 2023-07-27 DIAGNOSIS — R87610 Atypical squamous cells of undetermined significance on cytologic smear of cervix (ASC-US): Secondary | ICD-10-CM | POA: Insufficient documentation

## 2023-07-27 NOTE — Telephone Encounter (Signed)
Reached out to pt to schedule 28 week labs with the ROB appt that is scheduled on 08/01/2023 with Dr. Logan Bores at 3:15.  Could not leave a message, mailbox is not set up.

## 2023-07-30 ENCOUNTER — Encounter: Payer: Medicaid Other | Admitting: Advanced Practice Midwife

## 2023-07-30 LAB — COMPREHENSIVE METABOLIC PANEL
ALT: 90 [IU]/L — ABNORMAL HIGH (ref 0–32)
AST: 43 [IU]/L — ABNORMAL HIGH (ref 0–40)
Albumin: 3.3 g/dL — ABNORMAL LOW (ref 4.0–5.0)
Alkaline Phosphatase: 97 [IU]/L (ref 44–121)
BUN/Creatinine Ratio: 17 (ref 9–23)
BUN: 9 mg/dL (ref 6–20)
Bilirubin Total: 0.2 mg/dL (ref 0.0–1.2)
CO2: 21 mmol/L (ref 20–29)
Calcium: 8.1 mg/dL — ABNORMAL LOW (ref 8.7–10.2)
Chloride: 102 mmol/L (ref 96–106)
Creatinine, Ser: 0.53 mg/dL — ABNORMAL LOW (ref 0.57–1.00)
Globulin, Total: 2.7 g/dL (ref 1.5–4.5)
Glucose: 62 mg/dL — ABNORMAL LOW (ref 70–99)
Potassium: 3.5 mmol/L (ref 3.5–5.2)
Sodium: 136 mmol/L (ref 134–144)
Total Protein: 6 g/dL (ref 6.0–8.5)
eGFR: 129 mL/min/{1.73_m2} (ref >59)

## 2023-07-30 LAB — CBC/D/PLT+RPR+RH+ABO+RUBIGG...
Basophils Absolute: 0 10*3/uL (ref 0.0–0.2)
Basos: 0 %
EOS (ABSOLUTE): 0.1 10*3/uL (ref 0.0–0.4)
Eos: 1 %
HCV Ab: NONREACTIVE
HIV Screen 4th Generation wRfx: NONREACTIVE
Hematocrit: 30 % — ABNORMAL LOW (ref 34.0–46.6)
Hemoglobin: 10.1 g/dL — ABNORMAL LOW (ref 11.1–15.9)
Hepatitis B Surface Ag: NEGATIVE
Immature Grans (Abs): 0.2 10*3/uL — ABNORMAL HIGH (ref 0.0–0.1)
Immature Granulocytes: 2 %
Lymphocytes Absolute: 2.6 10*3/uL (ref 0.7–3.1)
Lymphs: 31 %
MCH: 32.7 pg (ref 26.6–33.0)
MCHC: 33.7 g/dL (ref 31.5–35.7)
MCV: 97 fL (ref 79–97)
Monocytes Absolute: 1 10*3/uL — ABNORMAL HIGH (ref 0.1–0.9)
Monocytes: 12 %
Neutrophils Absolute: 4.6 10*3/uL (ref 1.4–7.0)
Neutrophils: 54 %
Platelets: 328 10*3/uL (ref 150–450)
RBC: 3.09 x10E6/uL — ABNORMAL LOW (ref 3.77–5.28)
RDW: 12.7 % (ref 11.7–15.4)
RPR Ser Ql: REACTIVE — AB
Rh Factor: NEGATIVE
Rubella Antibodies, IGG: 0.94 {index} — ABNORMAL LOW (ref >0.99)
Varicella zoster IgG: REACTIVE
WBC: 8.4 10*3/uL (ref 3.4–10.8)

## 2023-07-30 LAB — HEMOGLOBIN A1C
Est. average glucose Bld gHb Est-mCnc: 117 mg/dL
Hgb A1c MFr Bld: 5.7 % — ABNORMAL HIGH (ref 4.8–5.6)

## 2023-07-30 LAB — HGB FRACTIONATION CASCADE
Hgb A2: 2.5 % (ref 1.8–3.2)
Hgb A: 97.5 % (ref 96.4–98.8)
Hgb F: 0 % (ref 0.0–2.0)
Hgb S: 0 %

## 2023-07-30 LAB — RPR, QUANT+TP ABS (REFLEX)
Rapid Plasma Reagin, Quant: 1:4 {titer} — ABNORMAL HIGH
T Pallidum Abs: REACTIVE — AB

## 2023-07-30 LAB — TSH+FREE T4
Free T4: 0.93 ng/dL (ref 0.82–1.77)
TSH: 1.58 u[IU]/mL (ref 0.450–4.500)

## 2023-07-30 LAB — HCV INTERPRETATION

## 2023-07-30 LAB — AB SCR+ANTIBODY ID

## 2023-07-30 NOTE — Telephone Encounter (Signed)
Pt is scheduled for 28 week labs on 08/01/2023 at 2:40.

## 2023-08-01 ENCOUNTER — Encounter: Payer: Medicaid Other | Admitting: Obstetrics and Gynecology

## 2023-08-01 ENCOUNTER — Other Ambulatory Visit: Payer: Medicaid Other

## 2023-08-01 ENCOUNTER — Telehealth: Payer: Self-pay | Admitting: Obstetrics and Gynecology

## 2023-08-01 ENCOUNTER — Other Ambulatory Visit: Payer: Self-pay

## 2023-08-01 DIAGNOSIS — Z130 Encounter for screening for diseases of the blood and blood-forming organs and certain disorders involving the immune mechanism: Secondary | ICD-10-CM

## 2023-08-01 DIAGNOSIS — Z131 Encounter for screening for diabetes mellitus: Secondary | ICD-10-CM

## 2023-08-01 DIAGNOSIS — Z3480 Encounter for supervision of other normal pregnancy, unspecified trimester: Secondary | ICD-10-CM

## 2023-08-01 DIAGNOSIS — A64 Unspecified sexually transmitted disease: Secondary | ICD-10-CM

## 2023-08-01 DIAGNOSIS — Z2913 Encounter for prophylactic Rho(D) immune globulin: Secondary | ICD-10-CM

## 2023-08-01 DIAGNOSIS — Z3A28 28 weeks gestation of pregnancy: Secondary | ICD-10-CM

## 2023-08-01 DIAGNOSIS — Z23 Encounter for immunization: Secondary | ICD-10-CM

## 2023-08-01 NOTE — Telephone Encounter (Signed)
 Reached out to pt to reschedule appts that were scheduled on 08/01/2023-lab appt was scheduled at 2:40 and the ROB appt was scheduled with Dr. Luster Salters at 3:15.  Left message for pt to call back.

## 2023-08-02 ENCOUNTER — Encounter: Payer: Self-pay | Admitting: Obstetrics and Gynecology

## 2023-08-02 NOTE — Telephone Encounter (Signed)
 Reached out to pt (2x) to reschedule appts that were scheduled on 08/01/2023-lab appt was scheduled at 2:40 and the ROB appt was scheduled with Dr. Luster Salters at 3:!5.  Left message for pt to call back.  Will send a MyChart letter.

## 2023-08-18 ENCOUNTER — Other Ambulatory Visit: Payer: Self-pay

## 2023-08-18 ENCOUNTER — Emergency Department
Admission: EM | Admit: 2023-08-18 | Discharge: 2023-08-18 | Disposition: A | Discharge status: Home / Self Care | Payer: Medicaid Other | Attending: Student in an Organized Health Care Education/Training Program | Admitting: Student in an Organized Health Care Education/Training Program

## 2023-08-18 DIAGNOSIS — B3731 Acute candidiasis of vulva and vagina: Secondary | ICD-10-CM | POA: Insufficient documentation

## 2023-08-18 DIAGNOSIS — A64 Unspecified sexually transmitted disease: Secondary | ICD-10-CM | POA: Diagnosis not present

## 2023-08-18 DIAGNOSIS — Z3A28 28 weeks gestation of pregnancy: Secondary | ICD-10-CM | POA: Insufficient documentation

## 2023-08-18 DIAGNOSIS — O98312 Other infections with a predominantly sexual mode of transmission complicating pregnancy, second trimester: Secondary | ICD-10-CM | POA: Diagnosis not present

## 2023-08-18 DIAGNOSIS — B9689 Other specified bacterial agents as the cause of diseases classified elsewhere: Secondary | ICD-10-CM

## 2023-08-18 DIAGNOSIS — O26892 Other specified pregnancy related conditions, second trimester: Secondary | ICD-10-CM | POA: Diagnosis present

## 2023-08-18 LAB — URINALYSIS, ROUTINE W REFLEX MICROSCOPIC
Bilirubin Urine: NEGATIVE
Glucose, UA: NEGATIVE mg/dL
Ketones, ur: NEGATIVE mg/dL
Nitrite: NEGATIVE
Protein, ur: NEGATIVE mg/dL
Specific Gravity, Urine: 1.017 (ref 1.005–1.030)
pH: 6 (ref 5.0–8.0)

## 2023-08-18 LAB — WET PREP, GENITAL
Clue Cells Wet Prep HPF POC: NONE SEEN
Sperm: NONE SEEN
Trich, Wet Prep: NONE SEEN
WBC, Wet Prep HPF POC: >=10 — AB (ref <10)

## 2023-08-18 LAB — CHLAMYDIA/NGC RT PCR (ARMC ONLY)
Chlamydia Tr: DETECTED — AB
N gonorrhoeae: NOT DETECTED

## 2023-08-18 MED ORDER — LIDOCAINE HCL (PF) 1 % IJ SOLN
5.0000 mL | Freq: Once | INTRAMUSCULAR | Status: AC
  Administered 2023-08-18: 2.1 mL
  Filled 2023-08-18: qty 5

## 2023-08-18 MED ORDER — MICONAZOLE NITRATE 2 % VA CREA: 1.0000 | TOPICAL_CREAM | Freq: Every day | VAGINAL | 0 refills | Status: DC

## 2023-08-18 MED ORDER — AZITHROMYCIN 500 MG PO TABS
1000.0000 mg | ORAL_TABLET | Freq: Every day | ORAL | Status: DC
  Administered 2023-08-18: 1000 mg via ORAL
  Filled 2023-08-18: qty 2

## 2023-08-18 MED ORDER — CEFTRIAXONE SODIUM 1 G IJ SOLR
500.0000 mg | Freq: Once | INTRAMUSCULAR | Status: AC
  Administered 2023-08-18: 500 mg via INTRAMUSCULAR
  Filled 2023-08-18: qty 10

## 2023-08-18 NOTE — ED Notes (Signed)
 Patient and friend were in a hurry to leave. No discharge vital signs taken.

## 2023-08-18 NOTE — Discharge Instructions (Signed)
 Call make an appointment with your OB/GYN The medications that you were given while in the emergency department will treat for gonorrhea and chlamydia. No sexual intercourse for 7 days.

## 2023-08-18 NOTE — ED Provider Notes (Signed)
 [facility] Provider Note    Event Date/Time   First MD Initiated Contact with Patient 08/18/23 1223     (approximate)   History   STI testing   HPI  Leslie Gilbert is a 28 y.o. female   presents to the ED requesting STI testing after seeing a "white discharge".  Patient currently is [redacted] weeks pregnant.  She denies any urinary symptoms.  Her OB/GYN is at [facility].  Looking through her records patient was treated for syphilis last year and received 3 injections of Bicillin.  She also recently was treated for chlamydia, yeast infection and gonorrhea.  She reports that she has 1 partner but is uncertain as to how many partners he has.  She admits to unprotected sex.      Physical Exam   Triage Vital Signs: ED Triage Vitals  Encounter Vitals Group     BP 08/18/23 1209 132/82     Systolic BP Percentile --      Diastolic BP Percentile --      Pulse Rate 08/18/23 1209 68     Resp 08/18/23 1209 18     Temp 08/18/23 1209 97.7 F (36.5 C)     Temp Source 08/18/23 1209 Oral     SpO2 08/18/23 1209 100 %     Weight 08/18/23 1214 130 lb (59 kg)     Height 08/18/23 1214 5\' 5"  (1.651 m)     Head Circumference --      Peak Flow --      Pain Score 08/18/23 1211 0     Pain Loc --      Pain Education --      Exclude from Growth Chart --     Most recent vital signs: Vitals:   08/18/23 1209  BP: 132/82  Pulse: 68  Resp: 18  Temp: 97.7 F (36.5 C)  SpO2: 100%     General: Awake, no distress.  CV:  Good peripheral perfusion.  Resp:  Normal effort.  Abd:  No distention.  Other:     ED Results / Procedures / Treatments   Labs (all labs ordered are listed, but only abnormal results are displayed) Labs Reviewed  WET PREP, GENITAL - Abnormal; Notable for the following components:      Result Value   Yeast Wet Prep HPF POC PRESENT (*)    WBC, Wet Prep HPF POC >=10 (*)    All other components within normal limits  URINALYSIS, ROUTINE W REFLEX  MICROSCOPIC - Abnormal; Notable for the following components:   Color, Urine YELLOW (*)    APPearance CLOUDY (*)    Hgb urine dipstick SMALL (*)    Leukocytes,Ua LARGE (*)    Bacteria, UA RARE (*)    All other components within normal limits  CHLAMYDIA/NGC RT PCR ([facility] ONLY)                PROCEDURES:  Critical Care performed:   Procedures   MEDICATIONS ORDERED IN ED: Medications  azithromycin (ZITHROMAX) tablet 1,000 mg (1,000 mg Oral Given 08/18/23 1457)  cefTRIAXone (ROCEPHIN) injection 500 mg (500 mg Intramuscular Given 08/18/23 1456)  lidocaine (PF) (XYLOCAINE) 1 % injection 5 mL (2.1 mLs Infiltration Given 08/18/23 1457)     IMPRESSION / MDM / ASSESSMENT AND PLAN / ED COURSE  I reviewed the triage vital signs and the nursing notes.   Differential diagnosis includes, but is not limited to, yeast vaginitis, urinary tract infection, chlamydia, gonorrhea.  28 year old female presents to the ED with concerns of an STD.  Patient denies any urinary symptoms however her urine is very cloudy with 21-50 RBCs and 21-50 WBCs with rare bacteria.  Urinalysis was not a clean-catch as it showed large amount of squamous epithelial cells.  Wet prep showed yeast and at the time of discharge chlamydia and gonorrhea test were still pending.  I spoke with her and she is completely unsure of her partner who is also here being treated.  He was given Rocephin and treated for chlamydia based on his history.  I spoke with the provider that was treating him and thought that it would be beneficial for her to also be treated at the same time.  She is to follow-up with [facility] if any continued problems and made aware that they will have access to her test results.      Patient's presentation is most consistent with acute complicated illness / injury requiring diagnostic workup.  FINAL CLINICAL IMPRESSION(S) / ED DIAGNOSES   Final diagnoses:  Vaginal yeast infection  STI (sexually transmitted  infection)     Rx / DC Orders   ED Discharge Orders          Ordered    miconazole (MONISTAT 7) 2 % vaginal cream  Daily at bedtime        08/18/23 1442             Note:  This document was prepared using Dragon voice recognition software and may include unintentional dictation errors.   Tommi Rumps, PA-C 08/18/23 1507    Willy Eddy, MD 08/19/23 [phone]

## 2023-08-18 NOTE — ED Notes (Signed)
 Patient and significant other are anxious to leave. PA-C aware.

## 2023-08-18 NOTE — ED Notes (Signed)
 Antibiotics and xylocaine administration and dosage verified with Kathalene Frames, pharmacist for mother/baby.

## 2023-08-18 NOTE — ED Triage Notes (Signed)
 Pt to ED asking for STI testing, is [redacted] wk pregnant, states having "white discharge" but no itching or other symptoms sine a few days ago. Denies urinary symptoms. Baby is moving.

## 2023-08-23 ENCOUNTER — Encounter: Payer: Self-pay | Admitting: Licensed Practical Nurse

## 2023-08-23 ENCOUNTER — Other Ambulatory Visit (HOSPITAL_COMMUNITY)
Admission: RE | Admit: 2023-08-23 | Discharge: 2023-08-23 | Disposition: A | Discharge status: Home / Self Care | Payer: Medicaid Other | Source: Ambulatory Visit | Attending: Licensed Practical Nurse | Admitting: Licensed Practical Nurse

## 2023-08-23 ENCOUNTER — Ambulatory Visit (INDEPENDENT_AMBULATORY_CARE_PROVIDER_SITE_OTHER): Payer: Medicaid Other | Admitting: Licensed Practical Nurse

## 2023-08-23 VITALS — BP 121/80 | HR 101 | Wt 143.7 lb

## 2023-08-23 DIAGNOSIS — Z3A31 31 weeks gestation of pregnancy: Secondary | ICD-10-CM | POA: Diagnosis not present

## 2023-08-23 DIAGNOSIS — O99891 Other specified diseases and conditions complicating pregnancy: Secondary | ICD-10-CM | POA: Diagnosis not present

## 2023-08-23 DIAGNOSIS — R399 Unspecified symptoms and signs involving the genitourinary system: Secondary | ICD-10-CM | POA: Diagnosis not present

## 2023-08-23 DIAGNOSIS — Z3403 Encounter for supervision of normal first pregnancy, third trimester: Secondary | ICD-10-CM

## 2023-08-23 DIAGNOSIS — Z131 Encounter for screening for diabetes mellitus: Secondary | ICD-10-CM

## 2023-08-23 DIAGNOSIS — N898 Other specified noninflammatory disorders of vagina: Secondary | ICD-10-CM | POA: Diagnosis present

## 2023-08-23 LAB — POCT URINALYSIS DIPSTICK
Bilirubin, UA: NEGATIVE
Blood, UA: NEGATIVE
Glucose, UA: NEGATIVE
Ketones, UA: NEGATIVE
Nitrite, UA: NEGATIVE
Protein, UA: NEGATIVE
Spec Grav, UA: 1.02 (ref 1.010–1.025)
Urobilinogen, UA: 0.2 U/dL
pH, UA: 7.5 (ref 5.0–8.0)

## 2023-08-23 NOTE — Progress Notes (Signed)
    Return Prenatal Note   Subjective   28 y.o. G2P0010 at [redacted]w[redacted]d presents for add on vaginal complaint   Patient Here with partner for complaints of vaginal discharge x 2 or 3 days that is yellow-white, denies an odor, itching, or spotting. She was seen in the ED on 2/22 for STI testing and "white discharge". She was presumptively treated with Azithromycin and rocephin. And given treated for yeast, which she completed yesterday. Wet prep negative, positive for chlamydia and yeast. Per ED note her partner was also present and being treated.  -reviewed this was an add on appointment for vaginal discharge, recommend returning in 1 week for A ROB so that we can spend more time addressing her pregnancy.   Patient reports: Movement: Present Contractions: Not present  Objective   Flow sheet Vitals: Pulse Rate: (!) 101 BP: 121/80 Fundal Height: 32 cm Fetal Heart Rate (bpm): 150 Total weight gain: 24 lb 11.2 oz (11.2 kg)  General Appearance  No acute distress, well appearing, and well nourished Pulmonary   Normal work of breathing Neurologic   Alert and oriented to person, place, and time Psychiatric   Mood and affect within normal limits  Pelvic exam             Cervix pink, no lesions, yellow homogenous discharge present, no bleeding however blood present on swab.  Negative CMT  Wet prep, slight PH change, negative whiff, few clue, negative hyphae or trich   Assessment/Plan   Plan  28 y.o. G2P0010 at [redacted]w[redacted]d presents for problem visit    -Pt recently treated for yeast and chlamydia, aptima swab sent  -On review of chart after pt left the exam room, she is Rh negative, has not yet had her GDM screening or Rhogam. Pt called unable to connect with mobile numbers listed. Mychart message sent asking pt to schedule appointment ASAP.   -28wk labs ordered   No problem-specific Assessment & Plan notes found for this encounter.      Orders Placed This Encounter  Procedures   Urine  Culture    Release to patient:   Immediate [1]   POCT Urinalysis Dipstick   Return in about 1 week (around 08/30/2023) for ROB.   Future Appointments  Date Time Provider Department Center  08/30/2023 10:35 AM Linzie Collin, MD [facility] None    For next visit:  continue with routine prenatal care     Ellouise Newer [facility], CNM  08/22/2510:17 PM

## 2023-08-25 LAB — URINE CULTURE

## 2023-08-26 LAB — CERVICOVAGINAL ANCILLARY ONLY
Bacterial Vaginitis (gardnerella): NEGATIVE
Comment: NEGATIVE
Comment: NEGATIVE
Trichomonas: POSITIVE — AB

## 2023-08-27 ENCOUNTER — Other Ambulatory Visit: Payer: Self-pay | Admitting: Licensed Practical Nurse

## 2023-08-27 ENCOUNTER — Telehealth: Payer: Self-pay | Admitting: Obstetrics and Gynecology

## 2023-08-27 ENCOUNTER — Encounter: Payer: Self-pay | Admitting: Licensed Practical Nurse

## 2023-08-27 DIAGNOSIS — A599 Trichomoniasis, unspecified: Secondary | ICD-10-CM

## 2023-08-27 MED ORDER — METRONIDAZOLE 500 MG PO TABS: 500.0000 mg | ORAL_TABLET | Freq: Two times a day (BID) | ORAL | 0 refills | Status: DC

## 2023-08-27 NOTE — Telephone Encounter (Addendum)
 I contacted the patient via phone x2. No answer. Voicemail is full unable to leave message.Contacting the patient to scheduled 1 hour glucose lab.

## 2023-08-28 NOTE — Telephone Encounter (Signed)
 Contacted the patient via phone, voicemail is full unable to leave message.

## 2023-08-30 ENCOUNTER — Telehealth: Payer: Self-pay | Admitting: Obstetrics and Gynecology

## 2023-08-30 ENCOUNTER — Encounter: Payer: Medicaid Other | Admitting: Obstetrics and Gynecology

## 2023-08-30 DIAGNOSIS — Z6791 Unspecified blood type, Rh negative: Secondary | ICD-10-CM

## 2023-08-30 DIAGNOSIS — Z3A32 32 weeks gestation of pregnancy: Secondary | ICD-10-CM

## 2023-08-30 DIAGNOSIS — Z3483 Encounter for supervision of other normal pregnancy, third trimester: Secondary | ICD-10-CM

## 2023-08-30 NOTE — Telephone Encounter (Signed)
 Reached out to pt to reschedule ROB appt that was scheduled on 08/6023 at 10:35 with Dr. Logan Bores.  Could not leave a message bc mailbox was full.  Pt also needs to schedule 1 hour glucose test.  Will send a MyChart message to pt.

## 2023-09-03 ENCOUNTER — Other Ambulatory Visit

## 2023-09-03 ENCOUNTER — Ambulatory Visit (INDEPENDENT_AMBULATORY_CARE_PROVIDER_SITE_OTHER): Admitting: Licensed Practical Nurse

## 2023-09-03 ENCOUNTER — Encounter: Payer: Self-pay | Admitting: Licensed Practical Nurse

## 2023-09-03 VITALS — BP 121/78 | HR 78 | Wt 147.0 lb

## 2023-09-03 DIAGNOSIS — Z23 Encounter for immunization: Secondary | ICD-10-CM | POA: Diagnosis not present

## 2023-09-03 DIAGNOSIS — Z2913 Encounter for prophylactic Rho(D) immune globulin: Secondary | ICD-10-CM | POA: Diagnosis not present

## 2023-09-03 DIAGNOSIS — O98319 Other infections with a predominantly sexual mode of transmission complicating pregnancy, unspecified trimester: Secondary | ICD-10-CM

## 2023-09-03 DIAGNOSIS — A599 Trichomoniasis, unspecified: Secondary | ICD-10-CM | POA: Diagnosis not present

## 2023-09-03 DIAGNOSIS — O98313 Other infections with a predominantly sexual mode of transmission complicating pregnancy, third trimester: Secondary | ICD-10-CM | POA: Diagnosis not present

## 2023-09-03 DIAGNOSIS — Z3A33 33 weeks gestation of pregnancy: Secondary | ICD-10-CM | POA: Diagnosis not present

## 2023-09-03 DIAGNOSIS — O26893 Other specified pregnancy related conditions, third trimester: Secondary | ICD-10-CM

## 2023-09-03 DIAGNOSIS — Z131 Encounter for screening for diabetes mellitus: Secondary | ICD-10-CM

## 2023-09-03 DIAGNOSIS — Z3403 Encounter for supervision of normal first pregnancy, third trimester: Secondary | ICD-10-CM

## 2023-09-03 DIAGNOSIS — Z3480 Encounter for supervision of other normal pregnancy, unspecified trimester: Secondary | ICD-10-CM

## 2023-09-03 LAB — POCT URINALYSIS DIPSTICK
Bilirubin, UA: NEGATIVE
Blood, UA: NEGATIVE
Glucose, UA: NEGATIVE
Ketones, UA: NEGATIVE
Leukocytes, UA: NEGATIVE
Nitrite, UA: NEGATIVE
Protein, UA: NEGATIVE
Spec Grav, UA: 1.015 (ref 1.010–1.025)
Urobilinogen, UA: 0.2 U/dL
pH, UA: 7 (ref 5.0–8.0)

## 2023-09-03 MED ORDER — RHO D IMMUNE GLOBULIN 1500 UNIT/2ML IJ SOSY
300.0000 ug | PREFILLED_SYRINGE | Freq: Once | INTRAMUSCULAR | Status: AC
  Administered 2023-09-03: 300 ug via INTRAMUSCULAR

## 2023-09-03 MED ORDER — FAMOTIDINE 20 MG PO TABS
20.0000 mg | ORAL_TABLET | Freq: Two times a day (BID) | ORAL | 3 refills | Status: DC
Start: 2023-09-03 — End: 2023-10-03

## 2023-09-03 MED ORDER — METRONIDAZOLE 500 MG PO TABS: ORAL_TABLET | ORAL | 1 refills | Status: DC

## 2023-09-03 NOTE — Assessment & Plan Note (Signed)
-  Rhogam and 28 wks labs today  -warning signs reviewed

## 2023-09-03 NOTE — Progress Notes (Signed)
    Return Prenatal Note   Subjective   28 y.o. G2P0010 at [redacted]w[redacted]d presents for this follow-up prenatal visit.  Patient Here with partner today.  -Leslie Gilbert has not received MyChart message regarding trich, she has not picked up her medication. She reports her phone number [phone] is a working number, she has it on silent often. Stressed the importance of the both of them being treated for trich and abstaining from IC until they are treated.  -Leslie Gilbert reports she has been reading about pregnancy, labor and parenting, she does have family and friends nearby for support. She would like to bottle feed. She would like Depo for contraception as she does not desires another pregnancy or sometime.  Patient reports: Movement: Present Contractions: Not present  Objective   Flow sheet Vitals: Pulse Rate: 78 BP: 121/78 Fundal Height: 33 cm Fetal Heart Rate (bpm): 135 Total weight gain: 28 lb (12.7 kg)  General Appearance  No acute distress, well appearing, and well nourished Pulmonary   Normal work of breathing Neurologic   Alert and oriented to person, place, and time Psychiatric   Mood and affect within normal limits  Assessment/Plan   Plan  28 y.o. G2P0010 at [redacted]w[redacted]d presents for follow-up OB visit. Reviewed prenatal record including previous visit note.  STD (sexually transmitted disease) complicating pregnancy, antepartum -resent script for flagyl with refill for expedited partner treatment -encouraged consistent condom use or abstaining from sex until the baby is born.   Supervision of other normal pregnancy, antepartum -Rhogam and 28 wks labs today  -warning signs reviewed      Orders Placed This Encounter  Procedures   Tdap vaccine greater than or equal to 7yo IM   POCT Urinalysis Dipstick   Return in about 2 weeks (around 09/17/2023) for ROB.   Future Appointments  Date Time Provider Department Center  09/17/2023 10:55 AM Glenetta Borg, CNM [facility] None    For next  visit:  continue with routine prenatal care     Ellouise Newer [facility], CNM  09/03/2510:14 PM

## 2023-09-03 NOTE — Assessment & Plan Note (Signed)
-  resent script for flagyl with refill for expedited partner treatment -encouraged consistent condom use or abstaining from sex until the baby is born.

## 2023-09-05 LAB — RPR, QUANT+TP ABS (REFLEX)
Rapid Plasma Reagin, Quant: 1:4 {titer} — ABNORMAL HIGH
T Pallidum Abs: REACTIVE — AB

## 2023-09-05 LAB — 28 WEEKS RH-PANEL
Antibody Screen: NEGATIVE
Basophils Absolute: 0 10*3/uL (ref 0.0–0.2)
Basos: 0 %
EOS (ABSOLUTE): 0 10*3/uL (ref 0.0–0.4)
Eos: 0 %
Gestational Diabetes Screen: 71 mg/dL (ref 70–139)
HIV Screen 4th Generation wRfx: NONREACTIVE
Hematocrit: 29.9 % — ABNORMAL LOW (ref 34.0–46.6)
Hemoglobin: 10.1 g/dL — ABNORMAL LOW (ref 11.1–15.9)
Immature Grans (Abs): 0.1 10*3/uL (ref 0.0–0.1)
Immature Granulocytes: 2 %
Lymphocytes Absolute: 3 10*3/uL (ref 0.7–3.1)
Lymphs: 35 %
MCH: 32.1 pg (ref 26.6–33.0)
MCHC: 33.8 g/dL (ref 31.5–35.7)
MCV: 95 fL (ref 79–97)
Monocytes Absolute: 0.7 10*3/uL (ref 0.1–0.9)
Monocytes: 8 %
Neutrophils Absolute: 4.8 10*3/uL (ref 1.4–7.0)
Neutrophils: 55 %
Platelets: 342 10*3/uL (ref 150–450)
RBC: 3.15 x10E6/uL — ABNORMAL LOW (ref 3.77–5.28)
RDW: 12.7 % (ref 11.7–15.4)
RPR Ser Ql: REACTIVE — AB
WBC: 8.8 10*3/uL (ref 3.4–10.8)

## 2023-09-17 ENCOUNTER — Ambulatory Visit (INDEPENDENT_AMBULATORY_CARE_PROVIDER_SITE_OTHER): Admitting: Obstetrics

## 2023-09-17 ENCOUNTER — Encounter: Payer: Self-pay | Admitting: Obstetrics

## 2023-09-17 ENCOUNTER — Other Ambulatory Visit (HOSPITAL_COMMUNITY)
Admission: RE | Admit: 2023-09-17 | Discharge: 2023-09-17 | Disposition: A | Discharge status: Home / Self Care | Source: Ambulatory Visit | Attending: Obstetrics | Admitting: Obstetrics

## 2023-09-17 VITALS — BP 135/86 | HR 84 | Wt 149.0 lb

## 2023-09-17 DIAGNOSIS — Z3A35 35 weeks gestation of pregnancy: Secondary | ICD-10-CM | POA: Diagnosis not present

## 2023-09-17 DIAGNOSIS — A64 Unspecified sexually transmitted disease: Secondary | ICD-10-CM

## 2023-09-17 DIAGNOSIS — O98113 Syphilis complicating pregnancy, third trimester: Secondary | ICD-10-CM | POA: Diagnosis not present

## 2023-09-17 DIAGNOSIS — O0930 Supervision of pregnancy with insufficient antenatal care, unspecified trimester: Secondary | ICD-10-CM

## 2023-09-17 DIAGNOSIS — O98119 Syphilis complicating pregnancy, unspecified trimester: Secondary | ICD-10-CM

## 2023-09-17 DIAGNOSIS — O98813 Other maternal infectious and parasitic diseases complicating pregnancy, third trimester: Secondary | ICD-10-CM | POA: Diagnosis present

## 2023-09-17 DIAGNOSIS — Z3480 Encounter for supervision of other normal pregnancy, unspecified trimester: Secondary | ICD-10-CM

## 2023-09-17 DIAGNOSIS — A539 Syphilis, unspecified: Secondary | ICD-10-CM

## 2023-09-17 DIAGNOSIS — A749 Chlamydial infection, unspecified: Secondary | ICD-10-CM | POA: Insufficient documentation

## 2023-09-17 DIAGNOSIS — F1410 Cocaine abuse, uncomplicated: Secondary | ICD-10-CM

## 2023-09-17 DIAGNOSIS — O99320 Drug use complicating pregnancy, unspecified trimester: Secondary | ICD-10-CM

## 2023-09-17 DIAGNOSIS — F1290 Cannabis use, unspecified, uncomplicated: Secondary | ICD-10-CM

## 2023-09-17 NOTE — Assessment & Plan Note (Signed)
-   TOC collected today

## 2023-09-17 NOTE — Assessment & Plan Note (Signed)
-  Recheck T. Palladium at next visit

## 2023-09-17 NOTE — Progress Notes (Signed)
    Return Prenatal Note   Assessment/Plan   Plan  28 y.o. G2P0010 at [redacted]w[redacted]d presents for follow-up OB visit. Reviewed prenatal record including previous visit note.  Syphilis affecting pregnancy, antepartum -Recheck T. Palladium at next visit   STD (sexually transmitted disease) complicating pregnancy, antepartum -TOC collected today  Supervision of other normal pregnancy, antepartum -Reviewed s/s of labor, danger signs, and when to go to the hospital -Discussed GBS swabs at next visit -Growth [place] ordered -Encourage abdominal support belt, avoiding heavy lifting/movements that increase abdominal pressure   Orders Placed This Encounter  Procedures   US OB Follow Up    Standing Status:   Future    Expected Date:   09/24/2023    Expiration Date:   09/16/2024    Reason for Exam (SYMPTOM  OR DIAGNOSIS REQUIRED):   growth    Preferred Imaging Location?:   Internal   Return in about 1 week (around 09/24/2023).   No future appointments.  For next visit:   ROB, growth scan    Subjective  Kerri-Anne is having pain where she had a prior hernia repaired. She feels lots of fetal movement. Denies ctx, LOF, and vagina bleeding.  Movement: Present Contractions: Not present  Objective   Flow sheet Vitals: Pulse Rate: 84 BP: 135/86 Total weight gain: 30 lb (13.6 kg)  General Appearance  No acute distress, well appearing, and well nourished Pulmonary   Normal work of breathing Neurologic   Alert and oriented to person, place, and time Psychiatric   Mood and affect within normal limits  Guadlupe Spanish, CNM 09/17/23 11:31 AM

## 2023-09-17 NOTE — Assessment & Plan Note (Addendum)
-  Reviewed s/s of labor, danger signs, and when to go to the hospital -Discussed GBS swabs at next visit -Growth [place] ordered -Encourage abdominal support belt, avoiding heavy lifting/movements that increase abdominal pressure

## 2023-09-18 ENCOUNTER — Other Ambulatory Visit: Payer: Self-pay | Admitting: Obstetrics

## 2023-09-18 ENCOUNTER — Encounter: Payer: Self-pay | Admitting: Obstetrics

## 2023-09-18 LAB — CERVICOVAGINAL ANCILLARY ONLY
Bacterial Vaginitis (gardnerella): POSITIVE — AB
Candida Glabrata: NEGATIVE
Candida Vaginitis: NEGATIVE
Chlamydia: NEGATIVE
Comment: NEGATIVE
Comment: NEGATIVE
Comment: NEGATIVE
Comment: NEGATIVE
Comment: NEGATIVE
Comment: NORMAL
Neisseria Gonorrhea: NEGATIVE
Trichomonas: NEGATIVE

## 2023-09-18 MED ORDER — METRONIDAZOLE 500 MG PO TABS: 500.0000 mg | ORAL_TABLET | Freq: Two times a day (BID) | ORAL | 0 refills | Status: DC

## 2023-09-18 NOTE — Progress Notes (Signed)
+  BV. Rx for metronidazole 500 mg PO BID x 7 days sent to pharmacy. [facility] notified via MyChart.  M. Chryl Heck, CNM

## 2023-09-23 ENCOUNTER — Observation Stay
Admission: EM | Admit: 2023-09-23 | Discharge: 2023-09-24 | Disposition: A | Discharge status: Home / Self Care | Attending: Licensed Practical Nurse | Admitting: Licensed Practical Nurse

## 2023-09-23 DIAGNOSIS — Z8659 Personal history of other mental and behavioral disorders: Secondary | ICD-10-CM | POA: Insufficient documentation

## 2023-09-23 DIAGNOSIS — F1290 Cannabis use, unspecified, uncomplicated: Secondary | ICD-10-CM | POA: Insufficient documentation

## 2023-09-23 DIAGNOSIS — Z3A36 36 weeks gestation of pregnancy: Secondary | ICD-10-CM | POA: Insufficient documentation

## 2023-09-23 DIAGNOSIS — O479 False labor, unspecified: Principal | ICD-10-CM | POA: Insufficient documentation

## 2023-09-23 DIAGNOSIS — O368130 Decreased fetal movements, third trimester, not applicable or unspecified: Secondary | ICD-10-CM | POA: Insufficient documentation

## 2023-09-23 DIAGNOSIS — F17210 Nicotine dependence, cigarettes, uncomplicated: Secondary | ICD-10-CM | POA: Insufficient documentation

## 2023-09-24 ENCOUNTER — Ambulatory Visit: Admission: RE | Admit: 2023-09-24 | Source: Ambulatory Visit

## 2023-09-24 ENCOUNTER — Encounter: Payer: Self-pay | Admitting: Obstetrics and Gynecology

## 2023-09-24 DIAGNOSIS — R109 Unspecified abdominal pain: Secondary | ICD-10-CM | POA: Diagnosis not present

## 2023-09-24 DIAGNOSIS — O479 False labor, unspecified: Secondary | ICD-10-CM | POA: Diagnosis present

## 2023-09-24 DIAGNOSIS — O368130 Decreased fetal movements, third trimester, not applicable or unspecified: Secondary | ICD-10-CM | POA: Diagnosis not present

## 2023-09-24 DIAGNOSIS — Z3A36 36 weeks gestation of pregnancy: Secondary | ICD-10-CM | POA: Diagnosis not present

## 2023-09-24 DIAGNOSIS — Z8659 Personal history of other mental and behavioral disorders: Secondary | ICD-10-CM | POA: Diagnosis not present

## 2023-09-24 DIAGNOSIS — O26893 Other specified pregnancy related conditions, third trimester: Secondary | ICD-10-CM | POA: Diagnosis not present

## 2023-09-24 DIAGNOSIS — F17210 Nicotine dependence, cigarettes, uncomplicated: Secondary | ICD-10-CM | POA: Diagnosis not present

## 2023-09-24 DIAGNOSIS — F1290 Cannabis use, unspecified, uncomplicated: Secondary | ICD-10-CM | POA: Diagnosis not present

## 2023-09-24 LAB — URINALYSIS, COMPLETE (UACMP) WITH MICROSCOPIC
Bacteria, UA: NONE SEEN
Bilirubin Urine: NEGATIVE
Glucose, UA: NEGATIVE mg/dL
Hgb urine dipstick: NEGATIVE
Ketones, ur: NEGATIVE mg/dL
Nitrite: NEGATIVE
Protein, ur: 30 mg/dL — AB
Specific Gravity, Urine: 1.021 (ref 1.005–1.030)
pH: 6 (ref 5.0–8.0)

## 2023-09-24 LAB — URINE DRUG SCREEN, QUALITATIVE (ARMC ONLY)
Amphetamines, Ur Screen: NOT DETECTED
Barbiturates, Ur Screen: NOT DETECTED
Benzodiazepine, Ur Scrn: NOT DETECTED
Cannabinoid 50 Ng, Ur ~~LOC~~: POSITIVE — AB
Cocaine Metabolite,Ur ~~LOC~~: POSITIVE — AB
MDMA (Ecstasy)Ur Screen: NOT DETECTED
Methadone Scn, Ur: NOT DETECTED
Opiate, Ur Screen: NOT DETECTED
Phencyclidine (PCP) Ur S: NOT DETECTED
Tricyclic, Ur Screen: NOT DETECTED

## 2023-09-24 MED ORDER — LACTATED RINGERS IV BOLUS
1000.0000 mL | Freq: Once | INTRAVENOUS | Status: AC
  Administered 2023-09-24: 1000 mL via INTRAVENOUS

## 2023-09-24 NOTE — OB Triage Note (Signed)
 Pt G2P0 at 36w with c/o UCs since 3/29. Increasing in intensity and timing. Last office visit last week. Last SVE no dilitation per pt. Pt reports + FM

## 2023-09-24 NOTE — Discharge Summary (Signed)
 Physician Final Progress Note  Patient ID: Leslie Gilbert MRN: [id number] DOB/AGE: 1995/11/10 28 y.o.  Admit date: 09/23/2023 Admitting provider: Linzie Collin, MD Discharge date: 09/24/2023   Admission Diagnoses:  1) intrauterine pregnancy at [redacted]w[redacted]d  2) Contractions  Discharge Diagnoses:  Principal Problem:   Uterine contractions  Not in labor  History of Present Illness: The patient is a 28 y.o. female G2P0010 at [redacted]w[redacted]d who presents for contractions. They began yesterday and she is feeling them about once an hour. She denies leakage of fluid.   Nashanti and her partner are both quite somnolent and difficult to arouse, but do briefly wake up and answer some questions.    Of note, she was recently diagnosed with BV and when asked if she has picked up her medication, she stated she had not.   Past Medical History:  Diagnosis Date   Breast mass    Syphilis    Tx with Bicillin  x3 at [facility] / 01/2023    Past Surgical History:  Procedure Laterality Date   HERNIA REPAIR     UMBILICAL HERNIA REPAIR     age 58 or 5    No current facility-administered medications on file prior to encounter.   Current Outpatient Medications on File Prior to Encounter  Medication Sig Dispense Refill   Prenatal Vit-Fe Fumarate-FA (PRENATAL VITAMIN) 27-0.8 MG TABS Take 1 tablet by mouth daily. 90 tablet 3   famotidine (PEPCID) 20 MG tablet Take 1 tablet (20 mg total) by mouth 2 (two) times daily. 60 tablet 3   metroNIDAZOLE (FLAGYL) 500 MG tablet Take 1 tablet (500 mg total) by mouth 2 (two) times daily. (Patient not taking: Reported on 09/24/2023) 14 tablet 0   miconazole (MONISTAT 7) 2 % vaginal cream Place 1 Applicatorful vaginally at bedtime. Apply for seven nights (Patient not taking: Reported on 08/23/2023) 30 g 0    Allergies  Allergen Reactions   Asa [Aspirin] Swelling    Social History   Socioeconomic History   Marital status: Single    Spouse name: Not on file   Number of children: 0    Years of education: Not on file   Highest education level: Not on file  Occupational History   Not on file  Tobacco Use   Smoking status: Every Day    Current packs/day: 0.50    Average packs/day: 0.5 packs/day for 6.0 years (3.0 ttl pk-yrs)    Types: Cigarettes   Smokeless tobacco: Never  Vaping Use   Vaping status: Never Used  Substance and Sexual Activity   Alcohol use: Not Currently    Alcohol/week: 6.0 standard drinks of alcohol    Types: 6 Cans of beer per week    Comment: last use 2023   Drug use: Yes    Types: Marijuana    Comment: daily   Sexual activity: Yes    Partners: Male, Female    Birth control/protection: None    Comment: stopped depo 2018  Other Topics Concern   Not on file  Social History Narrative   Not on file   Social Drivers of Health   Financial Resource Strain: High Risk (07/13/2023)   Overall Financial Resource Strain (CARDIA)    Difficulty of Paying Living Expenses: Hard  Food Insecurity: No Food Insecurity (07/13/2023)   Hunger Vital Sign    Worried About Running Out of Food in the Last Year: Never true    Ran Out of Food in the Last Year: Never true  Transportation  Needs: Unmet Transportation Needs (07/13/2023)   PRAPARE - Transportation    Lack of Transportation (Medical): Yes    Lack of Transportation (Non-Medical): Yes  Physical Activity: Inactive (07/13/2023)   Exercise Vital Sign    Days of Exercise per Week: 0 days    Minutes of Exercise per Session: 0 min  Stress: No Stress Concern Present (07/13/2023)   [employer] of Occupational Health - Occupational Stress Questionnaire    Feeling of Stress : Only a little  Social Connections: Moderately Isolated (07/13/2023)   Social Connection and Isolation Panel [NHANES]    Frequency of Communication with Friends and Family: More than three times a week    Frequency of Social Gatherings with Friends and Family: Twice a week    Attends Religious Services: More than 4 times per year     Active Member of [employer] or Organizations: No    Attends Banker Meetings: Never    Marital Status: Divorced  Catering manager Violence: Not At Risk (07/13/2023)   Humiliation, Afraid, Rape, and Kick questionnaire    Fear of Current or Ex-Partner: No    Emotionally Abused: No    Physically Abused: No    Sexually Abused: No    Family History  Problem Relation Age of Onset   Breast cancer Mother    Breast cancer Other      Review of Systems  Reason unable to perform ROS: Patient somnolent and difficult to arouse for long periods of time.     Physical Exam: BP 134/83   Pulse 71   Temp 98 F (36.7 C) (Oral)   Resp 16   LMP 01/15/2023 (Within Days)   Physical Exam Constitutional:      Comments: somnolent  Genitourinary:     Vulva normal.  Cardiovascular:     Rate and Rhythm: Normal rate.  Pulmonary:     Effort: Pulmonary effort is normal.  Abdominal:     Palpations: Abdomen is soft.     Comments: Gravid  Skin:    General: Skin is warm and dry.  Psychiatric:     Comments: Normal for current somnolent state     Consults: None  Significant Findings/ Diagnostic Studies: See UDS; + cocaine, + marijuana  Procedures: NST- reactive  Hospital Course: The patient was admitted to Labor and Delivery Triage for observation. She was placed on EFM for NST, which was reactive. IVF given and UDS sent which was positive for cocaine and marijuana. Haileigh and her partner are quite somnolent and awaken to name calling and physical touch on the shoulder. She falls asleep again quickly, but was able to consent to a cervical exam. Given cervical exam is 0.5/thick/0, will discharge home when IVF completed. GBS swab sent. Will attempt to provide discharge instructions once [facility] awakens more easily.   Discharge Condition: stable  Disposition: Discharge disposition: 01-Home or Self Care      Diet: Regular diet  Discharge Activity: Activity as tolerated   Allergies as of  09/24/2023       Reactions   Asa [aspirin] Swelling        Medication List     STOP taking these medications    miconazole 2 % vaginal cream Commonly known as: MONISTAT 7       TAKE these medications    famotidine 20 MG tablet Commonly known as: PEPCID Take 1 tablet (20 mg total) by mouth 2 (two) times daily.   metroNIDAZOLE 500 MG tablet Commonly known as: FLAGYL Take  1 tablet (500 mg total) by mouth 2 (two) times daily.   Prenatal Vitamin 27-0.8 MG Tabs Take 1 tablet by mouth daily.         Total time spent taking care of this patient: 20 minutes  Signed: Ellouise Newer [facility], CNM  09/24/2023, 1:45 AM

## 2023-09-24 NOTE — Progress Notes (Signed)
 Pt falling asleep during triage questions. Support person also asleep in chair. No one answering questions asked.

## 2023-09-26 ENCOUNTER — Encounter: Admitting: Obstetrics and Gynecology

## 2023-09-26 DIAGNOSIS — Z3A36 36 weeks gestation of pregnancy: Secondary | ICD-10-CM

## 2023-09-26 DIAGNOSIS — Z113 Encounter for screening for infections with a predominantly sexual mode of transmission: Secondary | ICD-10-CM

## 2023-09-26 DIAGNOSIS — Z3480 Encounter for supervision of other normal pregnancy, unspecified trimester: Secondary | ICD-10-CM

## 2023-09-26 DIAGNOSIS — Z3685 Encounter for antenatal screening for Streptococcus B: Secondary | ICD-10-CM

## 2023-09-27 ENCOUNTER — Telehealth: Payer: Self-pay | Admitting: Obstetrics and Gynecology

## 2023-09-27 ENCOUNTER — Encounter: Admitting: Obstetrics and Gynecology

## 2023-09-27 DIAGNOSIS — Z113 Encounter for screening for infections with a predominantly sexual mode of transmission: Secondary | ICD-10-CM

## 2023-09-27 DIAGNOSIS — Z3480 Encounter for supervision of other normal pregnancy, unspecified trimester: Secondary | ICD-10-CM

## 2023-09-27 DIAGNOSIS — Z3685 Encounter for antenatal screening for Streptococcus B: Secondary | ICD-10-CM

## 2023-09-27 DIAGNOSIS — Z3A36 36 weeks gestation of pregnancy: Secondary | ICD-10-CM

## 2023-09-27 NOTE — Telephone Encounter (Signed)
 Reached out to pt to reschedule ROB appt that was scheduled on 09/27/2023 at 10:35 with Dr. Logan Bores.  Could not leave a message bc call could not be completed at this time.

## 2023-09-28 ENCOUNTER — Encounter: Payer: Self-pay | Admitting: Obstetrics and Gynecology

## 2023-09-28 NOTE — Telephone Encounter (Signed)
 Contacted the patient via phone, no answer, Call couldn't be completed as dialed. / my chart letter sent

## 2023-09-30 ENCOUNTER — Inpatient Hospital Stay
Admission: EM | Admit: 2023-09-30 | Discharge: 2023-10-03 | DRG: 806 | Disposition: A | Discharge status: Home / Self Care | Attending: Obstetrics | Admitting: Obstetrics

## 2023-09-30 ENCOUNTER — Other Ambulatory Visit: Payer: Self-pay

## 2023-09-30 ENCOUNTER — Encounter: Payer: Self-pay | Admitting: Certified Nurse Midwife

## 2023-09-30 DIAGNOSIS — O26893 Other specified pregnancy related conditions, third trimester: Secondary | ICD-10-CM | POA: Diagnosis present

## 2023-09-30 DIAGNOSIS — F1290 Cannabis use, unspecified, uncomplicated: Secondary | ICD-10-CM | POA: Diagnosis present

## 2023-09-30 DIAGNOSIS — O1414 Severe pre-eclampsia complicating childbirth: Secondary | ICD-10-CM | POA: Diagnosis present

## 2023-09-30 DIAGNOSIS — F17210 Nicotine dependence, cigarettes, uncomplicated: Secondary | ICD-10-CM | POA: Diagnosis present

## 2023-09-30 DIAGNOSIS — O99334 Smoking (tobacco) complicating childbirth: Secondary | ICD-10-CM | POA: Diagnosis present

## 2023-09-30 DIAGNOSIS — O99324 Drug use complicating childbirth: Secondary | ICD-10-CM | POA: Diagnosis present

## 2023-09-30 DIAGNOSIS — Z3A36 36 weeks gestation of pregnancy: Secondary | ICD-10-CM | POA: Diagnosis not present

## 2023-09-30 DIAGNOSIS — O479 False labor, unspecified: Secondary | ICD-10-CM | POA: Diagnosis present

## 2023-09-30 DIAGNOSIS — O4693 Antepartum hemorrhage, unspecified, third trimester: Secondary | ICD-10-CM | POA: Diagnosis present

## 2023-09-30 DIAGNOSIS — A64 Unspecified sexually transmitted disease: Principal | ICD-10-CM

## 2023-09-30 DIAGNOSIS — R87610 Atypical squamous cells of undetermined significance on cytologic smear of cervix (ASC-US): Secondary | ICD-10-CM

## 2023-09-30 DIAGNOSIS — F1490 Cocaine use, unspecified, uncomplicated: Secondary | ICD-10-CM | POA: Diagnosis present

## 2023-09-30 LAB — URINE DRUG SCREEN, QUALITATIVE (ARMC ONLY)
Amphetamines, Ur Screen: NOT DETECTED
Barbiturates, Ur Screen: NOT DETECTED
Benzodiazepine, Ur Scrn: NOT DETECTED
Cannabinoid 50 Ng, Ur ~~LOC~~: POSITIVE — AB
Cocaine Metabolite,Ur ~~LOC~~: POSITIVE — AB
MDMA (Ecstasy)Ur Screen: NOT DETECTED
Methadone Scn, Ur: NOT DETECTED
Opiate, Ur Screen: NOT DETECTED
Phencyclidine (PCP) Ur S: NOT DETECTED
Tricyclic, Ur Screen: NOT DETECTED

## 2023-09-30 LAB — URINALYSIS, ROUTINE W REFLEX MICROSCOPIC
Bilirubin Urine: NEGATIVE
Glucose, UA: NEGATIVE mg/dL
Ketones, ur: NEGATIVE mg/dL
Nitrite: NEGATIVE
Protein, ur: 30 mg/dL — AB
RBC / HPF: >50 RBC/hpf (ref 0–5)
Specific Gravity, Urine: 1.005 (ref 1.005–1.030)
pH: 8 (ref 5.0–8.0)

## 2023-09-30 LAB — COMPREHENSIVE METABOLIC PANEL WITH GFR
ALT: 102 U/L — ABNORMAL HIGH (ref 0–44)
AST: 63 U/L — ABNORMAL HIGH (ref 15–41)
Albumin: 2.4 g/dL — ABNORMAL LOW (ref 3.5–5.0)
Alkaline Phosphatase: 293 U/L — ABNORMAL HIGH (ref 38–126)
Anion gap: 5 (ref 5–15)
BUN: 9 mg/dL (ref 6–20)
CO2: 24 mmol/L (ref 22–32)
Calcium: 8.4 mg/dL — ABNORMAL LOW (ref 8.9–10.3)
Chloride: 105 mmol/L (ref 98–111)
Creatinine, Ser: 0.77 mg/dL (ref 0.44–1.00)
GFR, Estimated: >60 mL/min (ref >60)
Glucose, Bld: 90 mg/dL (ref 70–99)
Potassium: 3.8 mmol/L (ref 3.5–5.1)
Sodium: 134 mmol/L — ABNORMAL LOW (ref 135–145)
Total Bilirubin: 0.9 mg/dL (ref 0.0–1.2)
Total Protein: 6.3 g/dL — ABNORMAL LOW (ref 6.5–8.1)

## 2023-09-30 LAB — CBC
HCT: 30.7 % — ABNORMAL LOW (ref 36.0–46.0)
Hemoglobin: 10.4 g/dL — ABNORMAL LOW (ref 12.0–15.0)
MCH: 30.4 pg (ref 26.0–34.0)
MCHC: 33.9 g/dL (ref 30.0–36.0)
MCV: 89.8 fL (ref 80.0–100.0)
Platelets: 342 10*3/uL (ref 150–400)
RBC: 3.42 MIL/uL — ABNORMAL LOW (ref 3.87–5.11)
RDW: 14.3 % (ref 11.5–15.5)
WBC: 12 10*3/uL — ABNORMAL HIGH (ref 4.0–10.5)
nRBC: 0.3 % — ABNORMAL HIGH (ref 0.0–0.2)

## 2023-09-30 LAB — TYPE AND SCREEN
ABO/RH(D): A NEG
Antibody Screen: POSITIVE

## 2023-09-30 LAB — WET PREP, GENITAL
Clue Cells Wet Prep HPF POC: NONE SEEN
Sperm: NONE SEEN
Trich, Wet Prep: NONE SEEN
WBC, Wet Prep HPF POC: <10 (ref <10)
Yeast Wet Prep HPF POC: NONE SEEN

## 2023-09-30 LAB — PROTEIN / CREATININE RATIO, URINE
Creatinine, Urine: 37 mg/dL
Protein Creatinine Ratio: 1.59 mg/mg{creat} — ABNORMAL HIGH (ref 0.00–0.15)
Total Protein, Urine: 59 mg/dL

## 2023-09-30 LAB — ABO/RH: ABO/RH(D): A NEG

## 2023-09-30 MED ORDER — CALCIUM GLUCONATE 10 % IV SOLN
INTRAVENOUS | Status: DC
Start: 2023-09-30 — End: 2023-10-01
  Filled 2023-09-30: qty 10

## 2023-09-30 MED ORDER — HYDRALAZINE HCL 20 MG/ML IJ SOLN
10.0000 mg | INTRAMUSCULAR | Status: DC | PRN
  Filled 2023-09-30: qty 1

## 2023-09-30 MED ORDER — LABETALOL HCL 5 MG/ML IV SOLN
INTRAVENOUS | Status: AC
  Administered 2023-09-30: 20 mg via INTRAVENOUS
  Filled 2023-09-30: qty 4

## 2023-09-30 MED ORDER — LACTATED RINGERS IV SOLN: 500.0000 mL | INTRAVENOUS | Status: DC | PRN

## 2023-09-30 MED ORDER — DIBUCAINE (PERIANAL) 1 % EX OINT: 1.0000 | TOPICAL_OINTMENT | CUTANEOUS | Status: DC | PRN

## 2023-09-30 MED ORDER — ZOLPIDEM TARTRATE 5 MG PO TABS: 5.0000 mg | ORAL_TABLET | Freq: Every evening | ORAL | Status: DC | PRN

## 2023-09-30 MED ORDER — LABETALOL HCL 5 MG/ML IV SOLN
20.0000 mg | INTRAVENOUS | Status: DC | PRN
  Administered 2023-10-02: 20 mg via INTRAVENOUS
  Filled 2023-09-30: qty 4

## 2023-09-30 MED ORDER — LACTATED RINGERS IV SOLN: INTRAVENOUS | Status: AC

## 2023-09-30 MED ORDER — DOCUSATE SODIUM 100 MG PO CAPS
100.0000 mg | ORAL_CAPSULE | Freq: Two times a day (BID) | ORAL | Status: DC
  Administered 2023-10-01 – 2023-10-02 (×4): 100 mg via ORAL
  Filled 2023-09-30 (×4): qty 1

## 2023-09-30 MED ORDER — MAGNESIUM SULFATE 40 GM/1000ML IV SOLN
2.0000 g/h | INTRAVENOUS | Status: DC
  Administered 2023-09-30 – 2023-10-01 (×2): 2 g/h via INTRAVENOUS
  Filled 2023-09-30 (×2): qty 1000

## 2023-09-30 MED ORDER — SIMETHICONE 80 MG PO CHEW: 80.0000 mg | CHEWABLE_TABLET | ORAL | Status: DC | PRN

## 2023-09-30 MED ORDER — SOD CITRATE-CITRIC ACID 500-334 MG/5ML PO SOLN: 30.0000 mL | ORAL | Status: DC | PRN

## 2023-09-30 MED ORDER — BENZOCAINE-MENTHOL 20-0.5 % EX AERO
1.0000 | INHALATION_SPRAY | CUTANEOUS | Status: DC | PRN
  Filled 2023-09-30: qty 56

## 2023-09-30 MED ORDER — AMMONIA AROMATIC IN INHA
RESPIRATORY_TRACT | Status: AC
  Filled 2023-09-30: qty 10

## 2023-09-30 MED ORDER — LIDOCAINE HCL (PF) 1 % IJ SOLN: 30.0000 mL | INTRAMUSCULAR | Status: DC | PRN

## 2023-09-30 MED ORDER — TRANEXAMIC ACID-NACL 1000-0.7 MG/100ML-% IV SOLN
INTRAVENOUS | Status: DC
Start: 2023-09-30 — End: 2023-10-01
  Filled 2023-09-30: qty 100

## 2023-09-30 MED ORDER — MEASLES, MUMPS & RUBELLA VAC IJ SOLR: 0.5000 mL | INTRAMUSCULAR | Status: DC | PRN

## 2023-09-30 MED ORDER — SODIUM CHLORIDE 0.9 % IV SOLN
5.0000 10*6.[IU] | Freq: Once | INTRAVENOUS | Status: AC
  Administered 2023-09-30: 5 10*6.[IU] via INTRAVENOUS
  Filled 2023-09-30: qty 5

## 2023-09-30 MED ORDER — DIPHENHYDRAMINE HCL 25 MG PO CAPS: 25.0000 mg | ORAL_CAPSULE | Freq: Four times a day (QID) | ORAL | Status: DC | PRN

## 2023-09-30 MED ORDER — ACETAMINOPHEN 325 MG PO TABS: 650.0000 mg | ORAL_TABLET | ORAL | Status: DC | PRN

## 2023-09-30 MED ORDER — PRENATAL MULTIVITAMIN CH
1.0000 | ORAL_TABLET | Freq: Every day | ORAL | Status: DC
  Administered 2023-10-01 – 2023-10-02 (×2): 1 via ORAL
  Filled 2023-09-30 (×2): qty 1

## 2023-09-30 MED ORDER — OXYTOCIN-SODIUM CHLORIDE 30-0.9 UT/500ML-% IV SOLN
INTRAVENOUS | Status: AC
  Administered 2023-09-30: 333 mL via INTRAVENOUS
  Filled 2023-09-30: qty 500

## 2023-09-30 MED ORDER — MEDROXYPROGESTERONE ACETATE 150 MG/ML IM SUSP: 150.0000 mg | INTRAMUSCULAR | Status: DC | PRN

## 2023-09-30 MED ORDER — ONDANSETRON HCL 4 MG/2ML IJ SOLN: 4.0000 mg | INTRAMUSCULAR | Status: DC | PRN

## 2023-09-30 MED ORDER — OXYTOCIN BOLUS FROM INFUSION: 333.0000 mL | Freq: Once | INTRAVENOUS | Status: AC

## 2023-09-30 MED ORDER — FERROUS SULFATE 325 (65 FE) MG PO TABS
325.0000 mg | ORAL_TABLET | Freq: Every day | ORAL | Status: DC
  Administered 2023-10-01 – 2023-10-03 (×3): 325 mg via ORAL
  Filled 2023-09-30 (×3): qty 1

## 2023-09-30 MED ORDER — OXYTOCIN 10 UNIT/ML IJ SOLN
INTRAMUSCULAR | Status: AC
  Filled 2023-09-30: qty 2

## 2023-09-30 MED ORDER — MAGNESIUM SULFATE BOLUS VIA INFUSION
4.0000 g | Freq: Once | INTRAVENOUS | Status: AC
  Administered 2023-09-30: 4 g via INTRAVENOUS
  Filled 2023-09-30: qty 1000

## 2023-09-30 MED ORDER — OXYTOCIN-SODIUM CHLORIDE 30-0.9 UT/500ML-% IV SOLN: 2.5000 [IU]/h | INTRAVENOUS | Status: DC

## 2023-09-30 MED ORDER — WITCH HAZEL-GLYCERIN EX PADS: 1.0000 | MEDICATED_PAD | CUTANEOUS | Status: DC | PRN

## 2023-09-30 MED ORDER — LABETALOL HCL 5 MG/ML IV SOLN
40.0000 mg | INTRAVENOUS | Status: DC | PRN
  Administered 2023-09-30 – 2023-10-02 (×2): 40 mg via INTRAVENOUS
  Filled 2023-09-30 (×2): qty 8

## 2023-09-30 MED ORDER — ACETAMINOPHEN 500 MG PO TABS
1000.0000 mg | ORAL_TABLET | Freq: Four times a day (QID) | ORAL | Status: DC | PRN
  Administered 2023-09-30 – 2023-10-01 (×2): 1000 mg via ORAL
  Filled 2023-09-30 (×3): qty 2

## 2023-09-30 MED ORDER — MISOPROSTOL 200 MCG PO TABS
ORAL_TABLET | ORAL | Status: AC
  Filled 2023-09-30: qty 4

## 2023-09-30 MED ORDER — ONDANSETRON HCL 4 MG/2ML IJ SOLN: 4.0000 mg | Freq: Four times a day (QID) | INTRAMUSCULAR | Status: DC | PRN

## 2023-09-30 MED ORDER — COCONUT OIL OIL: 1.0000 | TOPICAL_OIL | Status: DC | PRN

## 2023-09-30 MED ORDER — LABETALOL HCL 5 MG/ML IV SOLN
80.0000 mg | INTRAVENOUS | Status: DC | PRN
  Administered 2023-10-02: 80 mg via INTRAVENOUS
  Filled 2023-09-30: qty 16

## 2023-09-30 MED ORDER — PENICILLIN G POT IN DEXTROSE 60000 UNIT/ML IV SOLN: 3.0000 10*6.[IU] | INTRAVENOUS | Status: DC

## 2023-09-30 MED ORDER — IBUPROFEN 600 MG PO TABS
600.0000 mg | ORAL_TABLET | Freq: Four times a day (QID) | ORAL | Status: DC
  Administered 2023-09-30 – 2023-10-03 (×10): 600 mg via ORAL
  Filled 2023-09-30 (×11): qty 1

## 2023-09-30 MED ORDER — ONDANSETRON HCL 4 MG PO TABS: 4.0000 mg | ORAL_TABLET | ORAL | Status: DC | PRN

## 2023-09-30 MED ORDER — LIDOCAINE HCL (PF) 1 % IJ SOLN
INTRAMUSCULAR | Status: AC
  Filled 2023-09-30: qty 30

## 2023-09-30 MED ORDER — LACTATED RINGERS IV SOLN: INTRAVENOUS | Status: DC

## 2023-09-30 NOTE — Discharge Summary (Shared)
 Postpartum Discharge Summary  Date of Service updated***     Patient Name: Leslie Gilbert DOB: [birth year] MRN: [id number]  Date of admission: 09/30/2023 Delivery date:09/30/2023 Delivering provider: Dominica Severin Date of discharge: 09/30/2023  Admitting diagnosis: Vaginal bleeding in pregnancy, third trimester [O46.93] Uterine contractions [O47.9] Intrauterine pregnancy: [redacted]w[redacted]d     Secondary diagnosis:  Principal Problem:   Vaginal bleeding in pregnancy, third trimester Active Problems:   Severe pre-eclampsia affecting childbirth   Vaginal delivery   Postpartum care following vaginal delivery  Additional problems: ***    Discharge diagnosis: Preterm Pregnancy Delivered and Preeclampsia (severe)                                              Post partum procedures:{Postpartum procedures:23558} Augmentation: AROM Complications: {OB Labor/Delivery Complications:20784}  Hospital course: Onset of Labor With Vaginal Delivery      28 y.o. yo G2P0010 at [redacted]w[redacted]d was admitted in Active Labor on 09/30/2023. Labor course was complicated by***  Membrane Rupture Time/Date: 5:02 PM,09/30/2023  Delivery Method:Vaginal, Spontaneous Operative Delivery:N/A Episiotomy:   Lacerations:    Patient had a postpartum course complicated by pre-eclampsia with severe features by blood pressure.  She is ambulating, tolerating a regular diet, passing flatus, and urinating well. Patient is discharged home in stable condition on 09/30/23.  Newborn Data: Birth date:09/30/2023 Birth time:6:10 PM Gender:Female Living status:Living Apgars:8 ,8  Weight:2690 g  Magnesium Sulfate received: Yes: Seizure prophylaxis BMZ received: No Rhophylac:{Rhophylac received:30440032} WUJ:{WJX:91478295} T-DaP:Given prenatally Flu: N/A RSV Vaccine received: No Transfusion:{Transfusion received:30440034} Immunizations administered: Immunization History  Administered Date(s) Administered   HPV Quadrivalent 03/07/2007,  08/14/2007, 02/11/2008   Hepatitis A, Ped/Adol-2 Dose 01/18/2007, 08/14/2007   Hepatitis B, PED/ADOLESCENT 10/16/1994, 12/15/1994, 06/15/1995, 01/11/1996   Influenza, Seasonal, Injecte, Preservative Fre 07/18/2023   Meningococcal Conjugate 10/19/1998, 03/07/2007   Tdap 07/10/2019, 09/03/2023   Varicella 10/06/1997, 01/18/2007    Physical exam  Vitals:   09/30/23 1517 09/30/23 1537 09/30/23 1632  BP: (!) 152/93  (!) 140/93  Pulse: 67  78  Resp: 18    Temp: 97.9 F (36.6 C)    TempSrc: Oral    Weight:  67.6 kg   Height:  5\' 5"  (1.651 m)    General: {Exam; general:21111117} Lochia: {Desc; appropriate/inappropriate:30686::"appropriate"} Uterine Fundus: {Desc; firm/soft:30687} Incision: {Exam; incision:21111123} DVT Evaluation: {Exam; dvt:2111122} Labs: Lab Results  Component Value Date   WBC 12.0 (H) 09/30/2023   HGB 10.4 (L) 09/30/2023   HCT 30.7 (L) 09/30/2023   MCV 89.8 09/30/2023   PLT 342 09/30/2023      Latest Ref Rng & Units 09/30/2023    3:55 PM  CMP  Glucose 70 - 99 mg/dL 90   BUN 6 - 20 mg/dL 9   Creatinine 6.21 - 3.08 mg/dL 6.57   Sodium 846 - 962 mmol/L 134   Potassium 3.5 - 5.1 mmol/L 3.8   Chloride 98 - 111 mmol/L 105   CO2 22 - 32 mmol/L 24   Calcium 8.9 - 10.3 mg/dL 8.4   Total Protein 6.5 - 8.1 g/dL 6.3   Total Bilirubin 0.0 - 1.2 mg/dL 0.9   Alkaline Phos 38 - 126 U/L 293   AST 15 - 41 U/L 63   ALT 0 - 44 U/L 102    Edinburgh Score:     No data to display  After visit meds:  Allergies as of 09/30/2023       Reactions   Asa [aspirin] Swelling     Med Rec must be completed prior to using this [facility]***        Discharge home in stable condition Infant Feeding: {Baby feeding:23562} Infant Disposition:{CHL IP OB HOME WITH BJYNWG:95621} Discharge instruction: per After Visit Summary and Postpartum booklet. Activity: Advance as tolerated. Pelvic rest for 6 weeks.  Diet: {OB diet:21111121} Anticipated Birth Control:  {Birth Control:23956} Postpartum Appointment:{Outpatient follow up:23559} Additional Postpartum F/U: {PP Procedure:23957} Future Appointments:No future appointments. Follow up Visit:      09/30/2023 Dominica Severin, CNM

## 2023-09-30 NOTE — Progress Notes (Signed)
 Patient ID: Leslie Gilbert, female   DOB: [birth year], 28 y.o.   MRN: [id number] CNM ALbrecht updated me on pt status . + cocaine.  Mild range BP . + elevated Pr/ cr ratio, but + blood in specimen  Hold on magnesium for now  Anticipate SVD shortly . Will mag if severe range BP

## 2023-09-30 NOTE — Progress Notes (Signed)
 LABOR NOTE   SUBJECTIVE:   Gaige M Bake is a 28 y.o.  G2P0010  at [redacted]w[redacted]d in active labor, difficulty coping, unable to tolerate CEFM, straps removed & monitor held on by CNM or RN continuously at bedside, however with contractions Mckenzie requested removal of monitor due to pain.  Analgesia: Labor support without medications  OBJECTIVE:  BP (!) 140/93   Pulse 78   Temp 97.9 F (36.6 C) (Oral)   Resp 18   Ht 5\' 5"  (1.651 m)   Wt 67.6 kg   LMP 01/15/2023 (Within Days)   BMI 24.79 kg/m  No intake/output data recorded.  SVE:   Dilation: 10 Effacement (%): 80 Station: 0 Exam by:: Keitha Butte CONTRACTIONS: regular, every 2-4 minutes FHR: Fetal heart tracing reviewed. Baseline:  145 Category: unable to determine given intermittent monitoring at patient request Labs: Lab Results  Component Value Date   WBC 12.0 (H) 09/30/2023   HGB 10.4 (L) 09/30/2023   HCT 30.7 (L) 09/30/2023   MCV 89.8 09/30/2023   PLT 342 09/30/2023    ASSESSMENT: 1)  Spontaneous labor, involuntarily pushing, lip reduced effectively     Coping: difficulty coping, mother, FOB, RN & CNM at bedside continuously for support     Membranes: ruptured, meconium-moderate     GBS-unknown, received one dose Penicillin      2) Elevated blood pressure-mild range, LFTs mildly elevated, UP/C elevated-however contaminated with blood. Discussed with Dr. Feliberto Gottron, if [facility] will initiate magnesium.  Principal Problem:   Vaginal bleeding in pregnancy, third trimester Active Problems:   Severe pre-eclampsia affecting childbirth   Vaginal delivery   Postpartum care following vaginal delivery   PLAN: Begin pushing Monitor BP Anticipate NSVD   Dominica Severin, CNM 09/30/2023 7:16 PM

## 2023-09-30 NOTE — H&P (Signed)
 [facility] Labor & Delivery  History and Physical   HPI   Chief Complaint: contractions & vaginal bleeding  Leslie Gilbert is a 28 y.o. G2P0010 at [redacted]w[redacted]d who presents for contractions & vaginal bleeding. Contractions began 4-5h ago. She reports noting blood with wiping, denies loss of fluid. Denies visual changes, headache or RUQ pain.     Pregnancy Complications Patient Active Problem List   Diagnosis Date Noted   Vaginal bleeding in pregnancy, third trimester 09/30/2023   Uterine contractions 09/24/2023   ASCUS/HRHPV needs PP Colpo 07/27/2023   STD (sexually transmitted disease) complicating pregnancy, antepartum 07/20/2023   Syphilis affecting pregnancy, antepartum 06/11/2023   Supervision of other normal pregnancy, antepartum 06/11/2023   Late prenatal care 06/11/2023   Marijuana use during pregnancy 04/23/2020   Smoker 1/2-1 ppd 04/23/2020   Condylomata lata 04/23/2020    Review of Systems A twelve point review of systems was negative except as stated in HPI.   HISTORY   Medications Medications Prior to Admission  Medication Sig Dispense Refill Last Dose/Taking   metroNIDAZOLE (FLAGYL) 500 MG tablet Take 1 tablet (500 mg total) by mouth 2 (two) times daily. 14 tablet 0 09/30/2023   Prenatal Vit-Fe Fumarate-FA (PRENATAL VITAMIN) 27-0.8 MG TABS Take 1 tablet by mouth daily. 90 tablet 3 09/30/2023   famotidine (PEPCID) 20 MG tablet Take 1 tablet (20 mg total) by mouth 2 (two) times daily. (Patient not taking: Reported on 09/30/2023) 60 tablet 3 Not Taking    Allergies is allergic to asa [aspirin].   OB History OB History  Gravida Para Term Preterm AB Living  2 0 0 0 1 0  SAB IAB Ectopic Multiple Live Births  1 0 0 0 0    # Outcome Date GA Lbr Len/2nd Weight Sex Type Anes PTL Lv  2 Current           1 SAB 12/2022            Past Medical History Past Medical History:  Diagnosis Date   Breast mass    Syphilis    Tx with Bicillin  x3 at [facility] /  01/2023    Past Surgical History Past Surgical History:  Procedure Laterality Date   HERNIA REPAIR     UMBILICAL HERNIA REPAIR     age 47 or 5    Social History  reports that she has been smoking cigarettes. She has a 3 pack-year smoking history. She has never used smokeless tobacco. She reports that she does not currently use alcohol after a past usage of about 6.0 standard drinks of alcohol per week. She reports current drug use. Drugs: Marijuana and "Crack" cocaine.   Family History family history includes Breast cancer in her mother and another family member.   PHYSICAL EXAM   Vitals:   09/30/23 1517 09/30/23 1537  BP: (!) 152/93   Pulse: 67   Resp: 18   Temp: 97.9 F (36.6 C)   TempSrc: Oral   Weight:  67.6 kg  Height:  5\' 5"  (1.651 m)    Constitutional: No acute distress, well appearing, and well nourished. Neurologic: She is alert and conversational.  Psychiatric: She has a normal mood and affect.  Musculoskeletal: Normal gait, grossly normal range of motion Cardiovascular: Normal rate.   Pulmonary/Chest: Normal work of breathing.  Gastrointestinal/Abdominal: Soft. Gravid. There is no tenderness.  Skin: Skin is warm and dry. No rash noted.  Genitourinary: Normal external female genitalia.  SVE:   Dilation:  6 Effacement (%): 80 Station: -1 Presentation: Vertex Exam by:: Keitha Butte   NST Interpretation Indication: contractions Baseline: 150 bpm Variability: moderate Accelerations: absent Decelerations: absent Contractions: regular, every 3-5 minutes Time noted:  See OBIX Impression: equivocal Authenticated by: Dominica Severin    PRENATAL LABS FROM OB RESULTS CONSOLE  ABO, Rh: A/Negative/-- (01/22 1541) Antibody: Negative (03/10 0927) Rubella: 0.94 (01/22 1541) RPR: Reactive (03/10 0927)  HBsAg: Negative (01/22 1541)  HIV: Non Reactive (03/10 0927)  GBS: unknown     ASSESSMENT AND PLAN   Leslie Gilbert is a 28 y.o. G2P0010 at [redacted]w[redacted]d with EDD:  10/22/2023, Date entered prior to episode creation admitted for Preterm labor.  Expectant management - Reassess SVE in 2h, consider AROM  GBS unknown in setting of preterm labor - Start penicillin for prophylaxis  Polysubstance use in pregnancy - UDS ordered, has + cocaine & MJ in prior UDS-last 3/31 - TOC consult postpartum  Late/limited prenatal care - 5 prenatal visits, care initiated at 26w  Fetal Status: - cephalic presentation by SVE - EFW: 6# by Leopolds - CEFM - FHT currently cat I  Hx of syphilis & STI in pregnancy - Treated for syphilis 01/23/23, 01/31/2023, 02/07/2023 at [facility] - Treated for trichomonas & chlamydia at 26w, treated again for chlamydia at ED visit 2/22 & trich 3/10 with negative test of cure 3/24  Labs/Immunizations: TDAP: not received Flu: 07/18/23 Rubella: Non-immune Varicella immune HIV: negative Hep B: NR Hep C: NR RPR: reactive GBS: unknown  Lab Results  Component Value Date   VZVIGG Reactive 07/18/2023   HIV Non Reactive 09/03/2023     Postpartum Plan: - Feeding: Formula - Contraception: plans Depo-Provera - Prenatal Care Provider: [facility]  Attending Dr. Feliberto Gottron was immediately available for the care of the patient.

## 2023-10-01 LAB — COMPREHENSIVE METABOLIC PANEL WITH GFR
ALT: 73 U/L — ABNORMAL HIGH (ref 0–44)
AST: 46 U/L — ABNORMAL HIGH (ref 15–41)
Albumin: 1.9 g/dL — ABNORMAL LOW (ref 3.5–5.0)
Alkaline Phosphatase: 247 U/L — ABNORMAL HIGH (ref 38–126)
Anion gap: 5 (ref 5–15)
BUN: 7 mg/dL (ref 6–20)
CO2: 24 mmol/L (ref 22–32)
Calcium: 7.4 mg/dL — ABNORMAL LOW (ref 8.9–10.3)
Chloride: 109 mmol/L (ref 98–111)
Creatinine, Ser: 0.69 mg/dL (ref 0.44–1.00)
GFR, Estimated: >60 mL/min (ref >60)
Glucose, Bld: 114 mg/dL — ABNORMAL HIGH (ref 70–99)
Potassium: 3.7 mmol/L (ref 3.5–5.1)
Sodium: 138 mmol/L (ref 135–145)
Total Bilirubin: 0.5 mg/dL (ref 0.0–1.2)
Total Protein: 5.2 g/dL — ABNORMAL LOW (ref 6.5–8.1)

## 2023-10-01 LAB — CBC
HCT: 26 % — ABNORMAL LOW (ref 36.0–46.0)
Hemoglobin: 8.8 g/dL — ABNORMAL LOW (ref 12.0–15.0)
MCH: 29.6 pg (ref 26.0–34.0)
MCHC: 33.8 g/dL (ref 30.0–36.0)
MCV: 87.5 fL (ref 80.0–100.0)
Platelets: 323 10*3/uL (ref 150–400)
RBC: 2.97 MIL/uL — ABNORMAL LOW (ref 3.87–5.11)
RDW: 14.6 % (ref 11.5–15.5)
WBC: 18.6 10*3/uL — ABNORMAL HIGH (ref 4.0–10.5)
nRBC: 0.3 % — ABNORMAL HIGH (ref 0.0–0.2)

## 2023-10-01 LAB — RPR
RPR Ser Ql: REACTIVE — AB
RPR Titer: 1:1 {titer}

## 2023-10-01 LAB — FETAL SCREEN: Fetal Screen: NEGATIVE

## 2023-10-01 NOTE — Clinical Social Work Maternal (Signed)
 CLINICAL SOCIAL WORK MATERNAL/CHILD NOTE  Patient Details  Name: Leslie Gilbert MRN: [id number] Date of Birth: [birth year]  Date:  10/01/2023  Clinical Social Worker Initiating Note:  Leslie Speak, RN, BSN, CM Date/Time: Initiated:  10/01/23/      Child's Name:  Leslie Gilbert   Biological Parents:  Mother   Need for Interpreter:  None   Reason for Referral:   (Patient reports that she lives with a roomate and a friend.  She informs me that she has no other children or their is no other children in the home.)   Address:  [address] [place] [address]    Phone number:  [phone] (home)     Additional phone number: [phone]  Household Members/Support Persons (HM/SP):       HM/SP Name Relationship DOB or Age  HM/SP -1        HM/SP -2        HM/SP -3        HM/SP -4        HM/SP -5        HM/SP -6        HM/SP -7        HM/SP -8          Natural Supports (not living in the home):  Parent   Professional Supports:     Employment:     Type of Work:     Education:  Engineer, agricultural   Homebound arranged:    Surveyor, quantity Resources:  Other (Comment) (Medicaid pending)   Other Resources:  Food Stamps  , [facility]   Cultural/Religious Considerations Which May Impact Care:  None  Strengths:  Pediatrician chosen, Other  (Comment) (Has made arrangements for her baby to stay with her mother on discharge.  Mother has Temporary Guardianship paperwork.)   Psychotropic Medications:         Pediatrician:    [facility]  Pediatrician List:   [facility]  [facility]      Pediatrician Fax Number:    Risk Factors/Current Problems:  Substance Use  , Legal Issues   (Patient reports that she is on Probation.)   Cognitive State:  Alert     Mood/Affect:  Tearful     CSW Assessment:  Chart reviewed.  I have meet with patient, her mother, father, and FOB Leslie Damme  Gilbert) at bedside today.  I have explained HIPAA and I have asked support persons to step outside of the room.  Leslie Gilbert has requested that they remain.  I have explained my role to Leslie Gilbert and support persons.  I have informed Leslie Gilbert I have received a consult because her toxicology report was positive for Cocaine and Cannabinoid on 09-30-23 and Substance abuse use during pregnancy.  Noted that patient was positive for Cocaine on 09-24-23, and positive for Cannabinoid and Cotinine for 07-18-23.  I have also informed Leslie Gilbert that her baby was toxicology report was positive for Cocaine.  I have informed [name] hospital is monitoring baby for NAS and baby could have a prolonged hospital stay.  I have informed Leslie Gilbert that per hospital policy a CPS report has been made.  Patient's mother Leslie Gilbert informs me that she has Temporary Guardianship paperwork for the baby.  I have informed her that CPS report will still have to  be made.  I have informed her to please show paperwork to CPS Case worker.  Leslie Gilbert informs me she and her husband are agreeable to taking care of the baby until Leslie Gilbert is able to receive help for her drug abuse. CPS report made to Levander Campion with [facility] Department of Social Services.  I have made Leslie Gilbert aware that patient's mother Leslie Gilbert reports that she has received Temporary Guardianship paperwork for baby.  CPS will follow up with TOC case manager to determine disposition of the baby.     Patient confirms her address is [address] [place], [place] [address] and her contact number is [phone].  She informs me that her mother and father will be her support system for the baby.    Leslie Gilbert informs me that she lives with a friend and roommates.  She reports that no other children live in the home.  She informs me that she has WIC and FS.  She plans to bottle feed.  She denies any Mental Health history, Suicidal and homicidal  thoughts.  She denies any Domestic Violence.    Postpartum Depression and Sudden Infant death Syndrome information reviewed with patient and support system at bedside.  Typed information given to patient and family.  Patient reports that she will use Leslie Gilbert for the Pediatrician.    Patient reports that baby has a crib and a bassinet, diaper, clothes, and 3 car seats.  She reports that she will have transportation for appointments.    I have asked Substance abuse resources to patient discharge instructions.      CSW Plan/Description:  Psychosocial Support and Ongoing Assessment of Needs, Sudden Infant Death Syndrome (SIDS) Education, Other Patient/Family Education, Hospital Drug Screen Policy Information, Other Information/Referral to [facility], Child Therapist, nutritional Report  , CSW Awaiting CPS Disposition Plan, CSW Will Continue to Monitor Umbilical Cord Tissue Drug Screen Results and Make Report if Warranted    Leslie Reddish, RN 10/01/2023, 3:47 PM

## 2023-10-01 NOTE — Progress Notes (Signed)
 Brief Postpartum Progress Note:   BP now severe range requiring IV labetalol 20mg  then 40mg , now resolved to mild range. Magnesium sulfate for seizure protection infusing. Discussed with Chau & her mother diagnosis of pre-eclampsia with severe features, 24h of magnesium for seizure protection, treatment of blood pressures and remaining on labor & delivery for higher level monitoring. Deny questions at this time, in agreement with plan of care.  Dr. Feliberto Gottron updated and aware of plan of care.  Dominica Severin, CNM

## 2023-10-01 NOTE — Progress Notes (Addendum)
 Progress Note - Vaginal Delivery  Leslie Gilbert is a 28 y.o. [phone] now PP day 1 s/p Vaginal, Spontaneous. Postpartum Magnesium sulfate therapy.   Subjective:  The patient reports no complaints, tolerating PO, and foley catheter in place.  Sleeping with family member at the bedside  Objective:  Vital signs in last 24 hours: Temp:  [97.5 F (36.4 C)-98.7 F (37.1 C)] 98.2 F (36.8 C) (04/07 0736) Pulse Rate:  [67-105] 89 (04/07 0936) Resp:  [16-19] 16 (04/07 0736) BP: (106-195)/(62-112) 136/89 (04/07 0936) Weight:  [67.6 kg] 67.6 kg (04/06 1537)  Physical Exam:  General: cooperative, appears older than stated age, and no distress Lochia: appropriate Uterine Fundus: firm Lungs clear bilaterally,  Heart RRR +2 reflexes bilaterally     Data Review Recent Labs    09/30/23 1555 10/01/23 0549  HGB 10.4* 8.8*  HCT 30.7* 26.0*    Assessment/Plan: Principal Problem:   Vaginal bleeding in pregnancy, third trimester Active Problems:   Severe pre-eclampsia affecting childbirth   Vaginal delivery   Postpartum care following vaginal delivery   Continue magnesium sulfate therapy until 1800. Then transfer to postpartum. Social work consult placed.   -- Continue routine PP care.     Doreene Burke, CNM . 10/01/2023 10:26 AM

## 2023-10-02 LAB — CULTURE, BETA STREP (GROUP B ONLY)

## 2023-10-02 LAB — T.PALLIDUM AB, TOTAL: T Pallidum Abs: REACTIVE — AB

## 2023-10-02 LAB — SURGICAL PATHOLOGY

## 2023-10-02 MED ORDER — RHO D IMMUNE GLOBULIN 1500 UNIT/2ML IJ SOSY
300.0000 ug | PREFILLED_SYRINGE | Freq: Once | INTRAMUSCULAR | Status: AC
  Administered 2023-10-02: 300 ug via INTRAVENOUS
  Filled 2023-10-02: qty 2

## 2023-10-02 MED ORDER — NIFEDIPINE ER OSMOTIC RELEASE 30 MG PO TB24
30.0000 mg | ORAL_TABLET | Freq: Two times a day (BID) | ORAL | Status: DC
  Administered 2023-10-02 – 2023-10-03 (×2): 30 mg via ORAL
  Filled 2023-10-02 (×2): qty 1

## 2023-10-02 MED ORDER — NIFEDIPINE ER OSMOTIC RELEASE 30 MG PO TB24
30.0000 mg | ORAL_TABLET | Freq: Every day | ORAL | Status: DC
  Administered 2023-10-02: 30 mg via ORAL
  Filled 2023-10-02: qty 1

## 2023-10-02 NOTE — Progress Notes (Addendum)
 Post Partum Day 2 Subjective: Sudiksha is somnolent. She arouses to voice after multiple attempts.  Her mother and partner are at bedside.   Objective: Blood pressure (!) (P) 135/94, pulse 72, temperature 98.1 F (36.7 C), temperature source Oral, resp. rate 16, height 5\' 5"  (1.651 m), weight 67.6 kg, last menstrual period 01/15/2023, SpO2 100%, unknown if currently breastfeeding.   Physical Exam:  General:  Somnolent Cardiac: RRR, no murmurs, rubs, gallops auscultated Respiratory: CTAB   Will reassess bleeding, uterus, and perineum. RN reports stable.   Recent Labs    09/30/23 1555 10/01/23 0549  HGB 10.4* 8.8*  HCT 30.7* 26.0*    Assessment/Plan: -Received 20, 40, 80 mg of IV labetalol d/t SRBPs. Repeat BP above.  -Will give PO procardia as ordered and reassess for need for increase in dose or addition of PO labetalol.  -Continue routine postpartum care.  -Anticipate discharge tomorrow, pending stable BP on PO meds.  -MD Roby aware of BP and treatment.   LOS: 2 days    Eloise Levels, Student-MidWife 10/02/2023, 10:09 AM  Carie Caddy, CNM present for all portions of care.

## 2023-10-02 NOTE — Discharge Instructions (Signed)

## 2023-10-03 ENCOUNTER — Other Ambulatory Visit: Payer: Self-pay

## 2023-10-03 LAB — RHOGAM INJECTION: Unit division: 0

## 2023-10-03 MED ORDER — IBUPROFEN 600 MG PO TABS
600.0000 mg | ORAL_TABLET | Freq: Four times a day (QID) | ORAL | 0 refills | Status: DC
  Filled 2023-10-03: qty 30, 8d supply, fill #0

## 2023-10-03 MED ORDER — NIFEDIPINE ER 30 MG PO TB24
30.0000 mg | ORAL_TABLET | Freq: Two times a day (BID) | ORAL | 0 refills | Status: DC
  Filled 2023-10-03: qty 70, 35d supply, fill #0

## 2023-10-03 MED ORDER — ACETAMINOPHEN 500 MG PO TABS
1000.0000 mg | ORAL_TABLET | Freq: Four times a day (QID) | ORAL | 0 refills | Status: DC | PRN
  Filled 2023-10-03: qty 30, 4d supply, fill #0

## 2023-10-05 ENCOUNTER — Telehealth: Payer: Self-pay | Admitting: Advanced Practice Midwife

## 2023-10-05 ENCOUNTER — Ambulatory Visit: Admitting: Advanced Practice Midwife

## 2023-10-05 NOTE — Telephone Encounter (Signed)
 Reached out to pt to reschedule pp visit (blood pressure check) that was scheduled on 10/05/2023 at 10:35 with JEG.  Could not leave a message bc call could not  be completed.

## 2023-10-08 ENCOUNTER — Encounter: Payer: Self-pay | Admitting: Advanced Practice Midwife

## 2023-10-08 NOTE — Telephone Encounter (Signed)
 Reached out to pt (2x) to reschedule pp visit (blood pressure check) that was scheduled on 10/05/2023 at 10:35 with JEG.  Could not leave a message bc call could not be completed.  Will send a MyChart letter and message to pt.

## 2023-10-16 ENCOUNTER — Encounter: Payer: Self-pay | Admitting: Obstetrics

## 2023-10-22 ENCOUNTER — Inpatient Hospital Stay: Admit: 2023-10-22 | Payer: Medicaid Other

## 2023-11-07 NOTE — Progress Notes (Signed)
 Subjective:   Leslie Gilbert is a 28 y.o. female here for evaluation of    Chief Complaint  Patient presents with  . Hypertension   6 weeks postpartum, severe preeclampsia requiring early vaginal delivery. Nifedepine BID 30 mg working well for BP control. [facility] of [facility] [place].  Review of Systems - All other systems were reviewed and are negative unless stated in HPI.  Family History  Problem Relation Age of Onset  . No Known Problems Mother   . No Known Problems Father    History reviewed. No pertinent past medical history. History reviewed. No pertinent surgical history. Pediatric History  Patient Parents  . Not on file   Other Topics Concern  . Not on file  Social History Narrative  . Not on file    Objective:   BP 111/78 (BP Location: Left Upper Arm, Patient Position: Sitting)   Pulse 57   Temp 97.1 F (36.2 C) (Temporal)   Resp 20   Ht 5' 5 (1.651 m)   Wt 130 lb (59 kg)   SpO2 100%   Breastfeeding No   BMI 21.63 kg/m  Gen: Alert, oriented, non toxic, and well hydrated.  No signs of acute distress. Head: Normocephalic.  Atraumatic.  PERRLA.  Sclera anicteric.  Eyes: Extraocular movements intact.  Conjunctiva clear.  No foreign bodies noted. Ears:  Tympanic membranes clear.  Canals clear. Pharynx: No erythema or tonsillar hypertrophy.  Uvula midline. Neck: Supple. No lymphadenopathy Respiratory:  Lungs clear to auscultation.  No use of accessory muscles. Cardiovascular: Regular rate and rhythm.  No murmurs noted Abdominal:  Soft, non tender, non distended.  No hepatosplenomegally Neuro: Cranial nerves intact grossly.  No loss of strength, sensation Extremities:  Full range of motion.  No cyanosis, clubbing, or edema. Skin:  No rashes noted Psych: Oriented, alert.  Assessment:     ICD-10-CM   1. Leukocytosis, unspecified type  D72.829 CBC And Differential    POCT urinalysis dipstick    POCT urinalysis dipstick    CBC And  Differential    2. Hypertension affecting pregnancy, third trimester (*)  O16.3 Comprehensive Metabolic Panel    Comprehensive Metabolic Panel      Plan:  - Follow up with OBGYN for 6 week postpartum appt. - Follow up 3 months for annual physical, fasting labs, follow up HTN. - Take all medications as prescribed. - Return to clinic to be reevaluated if symptoms worsen, persist, change, or if you have any other concerns. - I discussed this diagnosis with the patient and discussed the treatment plan with them.  This treatment plan is also outlined in the Patient Instructions and a copy of this was provided to the patient.

## 2023-12-21 NOTE — Progress Notes (Signed)
  Subjective:   Leslie Gilbert is a 28 y.o. female who presents for follow up of depression. Chief Complaint  Patient presents with  . Depression    Was having a depressing mood started about 1 1/2 weeks ago.    Postpartum 2 months ago. Staying at [facility] - Enjoys being here. Feels she may be reflecting off other residents' emotions.  Patient Health Questionnaire 1. Little interest or pleasure in doing things: Not at all 2. Feeling down, depressed, or hopeless: Several days 3. Trouble falling or staying asleep: Not at all 4. Feeling tired or having little energy: Not at all 6. Feeling bad about yourself - or that you are a failure or have let yourself or your family down: Not at all 7. Trouble concentrating on things, such as reading the newspaper or watching television: Not at all 8. Moving or speaking so slowly that other people could have noticed.  Or the opposite - being so fidgety or restless that you have been moving around a lot more than usual.: Several days 9. Thoughts that you would be better off dead, or of hurting yourself in some way.: Not at all 10. How difficult have these problems made it for you to do your work, take care of things at home or get along with other people?: Somewhat difficult PHQ Total Score: 2  Review of Systems - All other systems were reviewed and are negative unless stated in HPI.  Family History  Problem Relation Age of Onset  . No Known Problems Mother   . No Known Problems Father    History reviewed. No pertinent past medical history. History reviewed. No pertinent surgical history. Pediatric History  Patient Parents  . Not on file   Other Topics Concern  . Not on file  Social History Narrative  . Not on file    Objective:   BP 112/71 (BP Location: Left Upper Arm, Patient Position: Sitting)   Pulse 56   Temp 96.8 F (36 C) (Temporal)   Resp 20   Ht 5' 5 (1.651 m)   Wt 130 lb 3.2 oz (59.1 kg)   LMP  (LMP Unknown)    SpO2 100%   Breastfeeding No   BMI 21.67 kg/m  Gen: Alert, oriented, non toxic, and well hydrated.  No signs of acute distress or pain.SABRA HEENT: Normocephalic.  Atraumatic.  Sclera anicteric. Neck: Supple Respiratory:  Lungs clear to auscultation.  No use of accessory muscles. Cardiovascular: Regular rate and rhythm.  No murmurs noted Abdominal:  Soft, non tender, non distended.  No hepatosplenomegally Neuro: Cranial nerves intact grossly.  No loss of strength, sensation Extremities:  Full range of motion.  No cyanosis or clubbing. Skin:  No rashes noted Psych: Oriented, alert.  Mood and affect are normal.  Answers questions appropriately.  No sadness or depressive sx noted.  Assessment:   1. Unprotected sex  POCT urine pregnancy test      Plan:   Orders Placed This Encounter  Procedures  . POCT urine pregnancy test    - Continue to take medications as prescribed.   - Return to clinic to be reevaluated if symptoms worsen, persist, change, or if you have any other concerns. - I discussed this diagnosis with the patient and discussed the treatment plan with them.  This treatment plan is also outlined in the Patient Instructions and a copy of this was provided to the patient.

## 2024-02-07 NOTE — Progress Notes (Signed)
 Subjective   Leslie Gilbert is a 28 y.o. female who presents for an annual exam.  [facility] with infant son, 5 months PP. No active medications on file.  Health Maintenance: Last wellness visit:  within the past year Diet:  general - no gluten, no sweeteners, whole foods during detox with program Vitamins/Supplements: folic acid, vitamin C Exercise frequency:  frequently Exercise type:  housecleaning, walking, weightlifting, and yard work Pap: was normal - OBGYN Mammogram:  patient has never had a mammogram DEXA:  NA Colonoscopy:  NA  Problem List[1] Medications Taking[2] Allergies Patient is allergic to aspirin.  Review of Systems - All other systems were reviewed and are negative unless stated in HPI.  Family History  Problem Relation Age of Onset  . No Known Problems Mother   . No Known Problems Father    Past Medical History:  Diagnosis Date  . 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 Late prenatal care (*) 06/11/2023  . Marijuana use during pregnancy (*) 04/23/2020  . Smoker 04/23/2020  . Syphilis affecting pregnancy, antepartum (*) 06/11/2023   -04/23/20- Pt was a 1:256, treated with 1x bicillin   -04/12/21- 1:2 in [place]  -12/06/22- Pt was 1:128, insufficient tx with 7 days of Doxy- did not take all  01/23/23- bicillin  1  01/31/23-bicillin  2  02/07/23- bicillin  3    . Vaginal delivery (*) 09/30/2023   History reviewed. No pertinent surgical history. Pediatric History  Patient Parents  . Not on file   Other Topics Concern  . Not on file  Social History Narrative  . Not on file    has no past surgical history on file.  Objective   BP 113/66 (BP Location: Left Upper Arm, Patient Position: Sitting)   Pulse 66   Temp 97.9 F (36.6 C) (Temporal)   Resp 16   Ht 5' 5 (1.651 m)   Wt 135 lb 3.2 oz (61.3 kg)   LMP 01/31/2024   SpO2 99%   Breastfeeding No   BMI 22.50 kg/m   General: The patient is a 28 y.o. female who appears to be in no acute  distress. Psych: She is alert and oriented to person, place, and time. Her mood and affect are normal. HEENT: Normocephalic, atraumatic, non-icteric sclera, PERRL.  Tympanic membranes are without perforation or infection.  Nasopharynx is grossly normal.  Oropharynx is without mass or exudate. Neck:  Supple.  Trachea is in midline position.  The neck is without adenopathy, masses, or thyromegaly. Lungs:  Good breath sounds are noted bilaterally.  The lungs are clear to auscultation and percussion bilaterally.  The spine and CVA region are  nontender to palpation. Cardio:  Regular rate and rhythm without gallop, rub, or murmur.  Abdomen:  Bowel sounds are physiologic.  The abdomen is soft and nontender to palpation.  No masses are noted.  No hepatosplenomegaly is noted.  Skin:  No rashes or worrisome lesions are noted.  Extremities:  The extremities are without cyanosis or significant contusions.  Pitting edema is not noted.  ROM is normal in all four extremities. Pulses: Adequate pulses are noted in all four extremities and both carotid arteries. Neuro:  Mental status is normal.  CN 2-11 are grossly normal.  Motor and sensory exams are grossly normal.  DTR are physiologic in all extremities.  Gait is stable. Breast Exam:  Deferred Pelvic:  Deferred   Impression     ICD-10-CM   1. Annual physical exam  Z00.00     2. Screening for  deficiency anemia  Z13.0 CBC And Differential    3. Lipid screening  Z13.220 Lipid Panel With LDL/HDL Ratio    4. Screening for diabetes mellitus  Z13.1 Comprehensive Metabolic Panel      Plan    Orders Placed This Encounter  Procedures  . CBC And Differential  . Comprehensive Metabolic Panel  . Lipid Panel With LDL/HDL Ratio    - Health maintenance issues including appropriate cancer screening, healthy diet, exercise and tobacco avoidance were discussed with the patient.  I've encouraged healthy lifestyle modifications of eating fruits/vegetables,  decreased fat intake, regular daily exercise, and decrease stress.  - Risks, benefits, and alternatives of the medications and treatment plan prescribed today were discussed, and patient expressed understanding.  - Pap smear and breast exam are done at her ob/gyn.  - Labs ordered today: cbc/d,cmp,tsh,vitamin d,lipid panel.  We will call pt with results. - Follow up in about 1 year (around 02/09/2025) for Annual physical with fasting labs and refills. - Return to clinic to be reevaluated if symptoms worsen, persist, change, or if you have any other concerns. - I discussed this diagnosis with the patient and discussed the treatment plan with them. This treatment plan is also outlined in the Patient Instructions and a copy of this was provided to the patient.        [1] Patient Active Problem List Diagnosis  (none) - all problems resolved or deleted  [2] No outpatient medications have been marked as taking for the 02/07/24 encounter (Annual Physical) with Megan G Bradley, FNP.

## 2024-03-31 NOTE — Progress Notes (Signed)
 Subjective:   Leslie Gilbert is a 28 y.o. female here for evaluation of    Chief Complaint  Patient presents with  . Contraception    Need referral to gyn for Nexplanon implant. Patient would like a gyn closer to where she lives.   [facility]ing well [facility]. Staying sober, happy raising her infant.  Review of Systems - All other systems were reviewed and are negative unless stated in HPI.  Family History  Problem Relation Age of Onset  . No Known Problems Mother   . No Known Problems Father    Past Medical History:  Diagnosis Date  . 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 Late prenatal care (*) 06/11/2023  . Marijuana use during pregnancy (*) 04/23/2020  . Smoker 04/23/2020  . Syphilis affecting pregnancy, antepartum (*) 06/11/2023   -04/23/20- Pt was a 1:256, treated with 1x bicillin   -04/12/21- 1:2 in [place]  -12/06/22- Pt was 1:128, insufficient tx with 7 days of Doxy- did not take all  01/23/23- bicillin  1  01/31/23-bicillin  2  02/07/23- bicillin  3    . Vaginal delivery (*) 09/30/2023   History reviewed. No pertinent surgical history. Pediatric History  Patient Parents  . Not on file   Other Topics Concern  . Not on file  Social History Narrative  . Not on file    Objective:   BP 102/65 (BP Location: Left Upper Arm, Patient Position: Sitting)   Pulse 66   Temp 98.5 F (36.9 C) (Temporal)   Resp 16   Ht 5' 5 (1.651 m)   Wt 145 lb 6.4 oz (66 kg)   LMP 01/31/2024   SpO2 98%   Breastfeeding No   BMI 24.20 kg/m  Gen: Alert, oriented, non toxic, and well hydrated.  No signs of acute distress. Head: Normocephalic.  Atraumatic.  PERRLA.  Sclera anicteric.  Eyes: Extraocular movements intact.  Conjunctiva clear.  No foreign bodies noted. Ears:  Tympanic membranes clear.  Canals clear. Pharynx: No erythema or tonsillar hypertrophy.  Uvula midline. Neck: Supple. No lymphadenopathy Respiratory:  Lungs clear to auscultation.  No use of accessory  muscles. Cardiovascular: Regular rate and rhythm.  No murmurs noted Abdominal:  Soft, non tender, non distended.  No hepatosplenomegally Neuro: Cranial nerves intact grossly.  No loss of strength, sensation Extremities:  Full range of motion.  No cyanosis, clubbing, or edema. Skin:  No rashes noted Psych: Oriented, alert.  Assessment:     ICD-10-CM   1. Insertion of Nexplanon  Z30.017 Ambulatory referral to Gynecology    CANCELED: Ambulatory Referral to Gynecology      Plan:   - Take all medications as prescribed. - Return to clinic to be reevaluated if symptoms worsen, persist, change, or if you have any other concerns. - I discussed this diagnosis with the patient and discussed the treatment plan with them.  This treatment plan is also outlined in the Patient Instructions and a copy of this was provided to the patient.

## 2024-05-28 ENCOUNTER — Encounter (HOSPITAL_BASED_OUTPATIENT_CLINIC_OR_DEPARTMENT_OTHER): Payer: Self-pay | Admitting: Emergency Medicine

## 2024-05-28 ENCOUNTER — Emergency Department (HOSPITAL_BASED_OUTPATIENT_CLINIC_OR_DEPARTMENT_OTHER)

## 2024-05-28 ENCOUNTER — Observation Stay (HOSPITAL_BASED_OUTPATIENT_CLINIC_OR_DEPARTMENT_OTHER): Admission: EM | Admit: 2024-05-28 | Discharge: 2024-05-30 | Disposition: A | Attending: Surgery | Admitting: Surgery

## 2024-05-28 ENCOUNTER — Other Ambulatory Visit: Payer: Self-pay

## 2024-05-28 DIAGNOSIS — F17210 Nicotine dependence, cigarettes, uncomplicated: Secondary | ICD-10-CM | POA: Insufficient documentation

## 2024-05-28 DIAGNOSIS — K8012 Calculus of gallbladder with acute and chronic cholecystitis without obstruction: Principal | ICD-10-CM | POA: Insufficient documentation

## 2024-05-28 DIAGNOSIS — Z886 Allergy status to analgesic agent status: Secondary | ICD-10-CM | POA: Insufficient documentation

## 2024-05-28 DIAGNOSIS — K8000 Calculus of gallbladder with acute cholecystitis without obstruction: Secondary | ICD-10-CM | POA: Diagnosis present

## 2024-05-28 DIAGNOSIS — R109 Unspecified abdominal pain: Principal | ICD-10-CM

## 2024-05-28 LAB — URINALYSIS, ROUTINE W REFLEX MICROSCOPIC
Bacteria, UA: NONE SEEN
Bilirubin Urine: NEGATIVE
Glucose, UA: NEGATIVE mg/dL
Hgb urine dipstick: NEGATIVE
Ketones, ur: NEGATIVE mg/dL
Nitrite: NEGATIVE
Specific Gravity, Urine: 1.033 — ABNORMAL HIGH (ref 1.005–1.030)
pH: 6 (ref 5.0–8.0)

## 2024-05-28 LAB — COMPREHENSIVE METABOLIC PANEL WITH GFR
ALT: 15 U/L (ref 0–44)
AST: 18 U/L (ref 15–41)
Albumin: 4.3 g/dL (ref 3.5–5.0)
Alkaline Phosphatase: 87 U/L (ref 38–126)
Anion gap: 8 (ref 5–15)
BUN: 13 mg/dL (ref 6–20)
CO2: 28 mmol/L (ref 22–32)
Calcium: 9.5 mg/dL (ref 8.9–10.3)
Chloride: 104 mmol/L (ref 98–111)
Creatinine, Ser: 0.75 mg/dL (ref 0.44–1.00)
GFR, Estimated: >60 mL/min (ref >60)
Glucose, Bld: 89 mg/dL (ref 70–99)
Potassium: 3.9 mmol/L (ref 3.5–5.1)
Sodium: 140 mmol/L (ref 135–145)
Total Bilirubin: 1 mg/dL (ref 0.0–1.2)
Total Protein: 7.5 g/dL (ref 6.5–8.1)

## 2024-05-28 LAB — CREATININE, SERUM
Creatinine, Ser: 0.75 mg/dL (ref 0.44–1.00)
GFR, Estimated: >60 mL/min (ref >60)

## 2024-05-28 LAB — CBC
HCT: 38.7 % (ref 36.0–46.0)
HCT: 34.8 % — ABNORMAL LOW (ref 36.0–46.0)
Hemoglobin: 12.8 g/dL (ref 12.0–15.0)
Hemoglobin: 11.7 g/dL — ABNORMAL LOW (ref 12.0–15.0)
MCH: 29.2 pg (ref 26.0–34.0)
MCH: 29.9 pg (ref 26.0–34.0)
MCHC: 33.1 g/dL (ref 30.0–36.0)
MCHC: 33.6 g/dL (ref 30.0–36.0)
MCV: 88.4 fL (ref 80.0–100.0)
MCV: 89 fL (ref 80.0–100.0)
Platelets: 307 K/uL (ref 150–400)
Platelets: 256 K/uL (ref 150–400)
RBC: 4.38 MIL/uL (ref 3.87–5.11)
RBC: 3.91 MIL/uL (ref 3.87–5.11)
RDW: 13.4 % (ref 11.5–15.5)
RDW: 13.6 % (ref 11.5–15.5)
WBC: 8.4 K/uL (ref 4.0–10.5)
WBC: 10 K/uL (ref 4.0–10.5)
nRBC: 0 % (ref 0.0–0.2)
nRBC: 0 % (ref 0.0–0.2)

## 2024-05-28 LAB — SURGICAL PCR SCREEN
MRSA, PCR: NEGATIVE
Staphylococcus aureus: NEGATIVE

## 2024-05-28 LAB — HIV ANTIBODY (ROUTINE TESTING W REFLEX): HIV Screen 4th Generation wRfx: NONREACTIVE

## 2024-05-28 LAB — PREGNANCY, URINE: Preg Test, Ur: NEGATIVE

## 2024-05-28 LAB — LIPASE, BLOOD: Lipase: 25 U/L (ref 11–51)

## 2024-05-28 MED ORDER — DIPHENHYDRAMINE HCL 50 MG/ML IJ SOLN: 25.0000 mg | Freq: Four times a day (QID) | INTRAMUSCULAR | Status: DC | PRN

## 2024-05-28 MED ORDER — ONDANSETRON 4 MG PO TBDP: 4.0000 mg | ORAL_TABLET | Freq: Four times a day (QID) | ORAL | Status: DC | PRN

## 2024-05-28 MED ORDER — METHOCARBAMOL 1000 MG/10ML IJ SOLN: 500.0000 mg | Freq: Three times a day (TID) | INTRAMUSCULAR | Status: DC | PRN

## 2024-05-28 MED ORDER — METHOCARBAMOL 500 MG PO TABS: 500.0000 mg | ORAL_TABLET | Freq: Three times a day (TID) | ORAL | Status: DC | PRN

## 2024-05-28 MED ORDER — DIPHENHYDRAMINE HCL 25 MG PO CAPS: 25.0000 mg | ORAL_CAPSULE | Freq: Four times a day (QID) | ORAL | Status: DC | PRN

## 2024-05-28 MED ORDER — POLYETHYLENE GLYCOL 3350 17 G PO PACK
17.0000 g | PACK | Freq: Every day | ORAL | Status: DC | PRN
  Administered 2024-05-30: 17 g via ORAL
  Filled 2024-05-28: qty 1

## 2024-05-28 MED ORDER — ENOXAPARIN SODIUM 40 MG/0.4ML IJ SOSY
40.0000 mg | PREFILLED_SYRINGE | INTRAMUSCULAR | Status: DC
  Administered 2024-05-28: 40 mg via SUBCUTANEOUS
  Filled 2024-05-28: qty 0.4

## 2024-05-28 MED ORDER — LACTATED RINGERS IV SOLN: INTRAVENOUS | Status: AC

## 2024-05-28 MED ORDER — KETOROLAC TROMETHAMINE 15 MG/ML IJ SOLN
15.0000 mg | Freq: Once | INTRAMUSCULAR | Status: AC
  Administered 2024-05-28: 15 mg via INTRAVENOUS
  Filled 2024-05-28: qty 1

## 2024-05-28 MED ORDER — INDOCYANINE GREEN 25 MG IJ SOLR
1.2500 mg | INTRAMUSCULAR | Status: AC
  Administered 2024-05-29: 1.25 mg via INTRAVENOUS

## 2024-05-28 MED ORDER — ACETAMINOPHEN 325 MG PO TABS
650.0000 mg | ORAL_TABLET | Freq: Four times a day (QID) | ORAL | Status: DC | PRN
  Administered 2024-05-29 (×2): 650 mg via ORAL
  Filled 2024-05-28 (×2): qty 2

## 2024-05-28 MED ORDER — IBUPROFEN 200 MG PO TABS: 600.0000 mg | ORAL_TABLET | Freq: Four times a day (QID) | ORAL | Status: DC | PRN

## 2024-05-28 MED ORDER — LACTATED RINGERS IV BOLUS
1000.0000 mL | Freq: Once | INTRAVENOUS | Status: AC
  Administered 2024-05-28: 1000 mL via INTRAVENOUS

## 2024-05-28 MED ORDER — MUPIROCIN 2 % EX OINT
1.0000 | TOPICAL_OINTMENT | Freq: Two times a day (BID) | CUTANEOUS | Status: DC
  Administered 2024-05-29 – 2024-05-30 (×3): 1 via NASAL
  Filled 2024-05-28 (×2): qty 22

## 2024-05-28 MED ORDER — SODIUM CHLORIDE 0.9 % IV SOLN
2.0000 g | INTRAVENOUS | Status: DC
  Administered 2024-05-28: 2 g via INTRAVENOUS
  Filled 2024-05-28: qty 20

## 2024-05-28 MED ORDER — ACETAMINOPHEN 650 MG RE SUPP: 650.0000 mg | Freq: Four times a day (QID) | RECTAL | Status: DC | PRN

## 2024-05-28 MED ORDER — ONDANSETRON HCL 4 MG/2ML IJ SOLN: 4.0000 mg | Freq: Four times a day (QID) | INTRAMUSCULAR | Status: DC | PRN

## 2024-05-28 MED ORDER — DOCUSATE SODIUM 100 MG PO CAPS
100.0000 mg | ORAL_CAPSULE | Freq: Two times a day (BID) | ORAL | Status: DC
  Administered 2024-05-29 – 2024-05-30 (×2): 100 mg via ORAL
  Filled 2024-05-28 (×4): qty 1

## 2024-05-28 MED ORDER — METHOCARBAMOL 500 MG PO TABS
500.0000 mg | ORAL_TABLET | Freq: Once | ORAL | Status: AC
  Administered 2024-05-28: 500 mg via ORAL
  Filled 2024-05-28: qty 1

## 2024-05-28 NOTE — ED Provider Notes (Signed)
 [facility] AT [facility] Provider Note   CSN: [id number] Arrival date & time: 05/28/24  0825     History Chief Complaint  Patient presents with   Abdominal Pain    HPI: PETINA MURASKI is a 28 y.o. female with history pertinent for prior umbilical hernia status postrepair, polysubstance use disorder in remission who presents complaining of abdominal pain. Patient arrived via POV accompanied by Oscar, patient's case manager who request stays in the room during her evaluation.  History provided by patient.  No interpreter required during this encounter.  Patient reports that she has had 2 days of right sided abdominal pain.  Reports that it is worse in the right side, however also radiates to her back as well as her left side.  Reports that it is worsened by eating, partially alleviated by lack of oral intake, however has not completely resolved.  Reports that she did have nausea and vomiting yesterday, no vomiting today.  Denies fever, chills, chest pain, shortness of breath, diarrhea, dysuria, hematuria, though does note that her urine is dark.  Denies pelvic pain, vaginal bleeding.  Patient request that she not be administered narcotic pain medications.  Patient's recorded medical, surgical, social, medication list and allergies were reviewed in the Snapshot window as part of the initial history.   Prior to Admission medications   Medication Sig Start Date End Date Taking? Authorizing Provider  celecoxib  (CELEBREX ) 100 MG capsule Take 1 capsule (100 mg total) by mouth 2 (two) times daily for 10 days. 05/30/24 06/09/24 Yes Vicci Burnard SAUNDERS, PA-C  acetaminophen  (TYLENOL ) 500 MG tablet Take 2 tablets (1,000 mg total) by mouth every 6 (six) hours as needed for mild pain (pain score 1-3), fever or headache. 05/30/24   Vicci Burnard SAUNDERS, PA-C  ibuprofen  (ADVIL ) 600 MG tablet Take 1 tablet (600 mg total) by mouth 3 (three) times daily. 05/30/24   Vicci Burnard SAUNDERS,  PA-C  lidocaine  (LIDODERM ) 5 % Place 1 patch onto the skin daily. Remove & Discard patch within 12 hours or as directed by MD 05/31/24   Vicci Burnard SAUNDERS, PA-C  polyethylene glycol (MIRALAX  / GLYCOLAX ) 17 g packet Take 17 g by mouth daily as needed for mild constipation. 05/30/24   Vicci Burnard SAUNDERS, PA-C  simethicone  (MYLICON) 80 MG chewable tablet Chew 1 tablet (80 mg total) by mouth every 6 (six) hours as needed for flatulence. 05/30/24   Vicci Burnard SAUNDERS, PA-C     Allergies: Asa [aspirin]   Review of Systems   ROS as per HPI  Physical Exam Updated Vital Signs BP 120/72 (BP Location: Right Arm)   Pulse 72   Temp 98.5 F (36.9 C)   Resp 20   Ht 5' 5 (1.651 m)   Wt 67 kg   LMP 05/23/2024   SpO2 99%   BMI 24.58 kg/m  Physical Exam Vitals and nursing note reviewed.  Constitutional:      General: She is not in acute distress.    Appearance: Normal appearance. She is well-developed.  HENT:     Head: Normocephalic and atraumatic.  Eyes:     Extraocular Movements: Extraocular movements intact.     Conjunctiva/sclera: Conjunctivae normal.  Cardiovascular:     Rate and Rhythm: Normal rate and regular rhythm.     Pulses: Normal pulses.     Heart sounds: No murmur heard. Pulmonary:     Effort: Pulmonary effort is normal. No respiratory distress.     Breath sounds: Normal  breath sounds.  Abdominal:     Palpations: Abdomen is soft.     Tenderness: There is abdominal tenderness in the right upper quadrant and suprapubic area. There is guarding (Right upper quadrant tenderness with involuntary guarding). There is no rebound. Positive signs include Murphy's sign. Negative signs include McBurney's sign.  Musculoskeletal:        General: No swelling.     Cervical back: Neck supple.  Skin:    General: Skin is warm and dry.     Capillary Refill: Capillary refill takes less than 2 seconds.  Neurological:     General: No focal deficit present.     Mental Status: She is alert and oriented  to person, place, and time.  Psychiatric:        Mood and Affect: Mood normal.     ED Course/ Medical Decision Making/ A&P   Procedures Procedures   Medications Ordered in ED Medications  lactated ringers  infusion (0 mLs Intravenous Stopped 05/29/24 1635)  lactated ringers  bolus 1,000 mL (0 mLs Intravenous Stopped 05/28/24 1055)  methocarbamol  (ROBAXIN ) tablet 500 mg (500 mg Oral Given 05/28/24 0915)  ketorolac  (TORADOL ) 15 MG/ML injection 15 mg (15 mg Intravenous Given 05/28/24 0933)  indocyanine green  (IC-GREEN ) injection 1.25 mg (1.25 mg Intravenous Given 05/29/24 1029)  chlorhexidine  (PERIDEX ) 0.12 % solution 15 mL (15 mLs Mouth/Throat Given 05/29/24 1036)    Or  Oral care mouth rinse ( Mouth Rinse See Alternative 05/29/24 1036)    Medical Decision Making:   MAHASIN RIVIERE is a 28 y.o. female who presents for abdominal pain as per above.  Physical exam is pertinent for right upper quadrant tenderness and involuntary guarding, positive Murphy sign  The differential includes but is not limited to cholecystitis, biliary colic, bowel obstruction, pregnancy, appendicitis, pyelonephritis, pancreatitis.  Independent historian: None  External data reviewed: No pertinent external data  Initial Plan:  Screening labs including CBC and Metabolic panel to evaluate for infectious or metabolic etiology of disease.  Screening lipase for pancreatitis Urinalysis with reflex culture ordered to evaluate for UTI or relevant urologic/nephrologic pathology.  Right upper quadrant ultrasound to evaluate for cholecystitis  Labs: Ordered, Independent interpretation, and Details: CBC without leukocytosis, anemia, thrombocytopenia.  CMP without AKI, emergent electro gentian, emergent LFT abnormality.  Lipase WNL.  UA without evidence of UTI.  Urine pregnancy test negative.  Radiology: Ordered, Independent interpretation, Details: Personally reviewed patient's right upper quadrant ultrasound, I do  appreciate gallbladder wall thickening and echodense material within the gallbladder concerning for sludge versus gallstones, and All images reviewed independently.  Agree with radiology report at this time.   US  Abdomen Limited RUQ (LIVER/GB) Result Date: 05/28/2024 CLINICAL DATA:  Postprandial right upper quadrant pain with nausea and vomiting. EXAM: ULTRASOUND ABDOMEN LIMITED RIGHT UPPER QUADRANT COMPARISON:  None Available. FINDINGS: Gallbladder: Multiple gallstones in a nondistended gallbladder. The gallbladder is diffusely inflamed and edematous with significant diffuse wall thickening measuring up to 12 mm. The patient was tender during the exam overlying the gallbladder. Common bile duct: Diameter: 3 mm Liver: No focal lesion identified. Within normal limits in parenchymal echogenicity. Portal vein is patent on color Doppler imaging with normal direction of blood flow towards the liver. Other: No ascites or pericholecystic fluid seen. IMPRESSION: Cholelithiasis with diffuse gallbladder wall thickening and edema. Focal tenderness overlying the gallbladder. Findings are consistent with cholecystitis. Lack of gallbladder distension may be consistent with chronic cholecystitis. Electronically Signed   By: Marcey Moan M.D.   On: 05/28/2024 11:06  EKG/Medicine tests: Not indicated EKG Interpretation:   Interventions: Robaxin , Toradol , LR bolus  See the EMR for full details regarding lab and imaging results.  Patient presents to the emergency department for right upper quadrant pain.  Patient does have focal tenderness as well as Murphy sign on exam.  Patient does report significant pain, however would like to defer opioids at this time given she is in remission from substance use disorder.  Thus given Robaxin  and Toradol  with partial improvement in pain as well as LR bolus.  Patient with negative pregnancy test, no evidence of UTI, lipase WNL, therefore doubt pancreatitis, BMP and CBC reassuring.   Right upper quadrant ultrasound does demonstrate evidence of acute cholecystitis.  Consulted general surgery and spoke with Dr. Vanderbilt who states that surgery can evaluate the patient in the [facility], ED.  Dr. Laurice accepts the patient as an ED to ED transfer.  Presentation is most consistent with acute complicated illness  Discussion of management or test interpretations with external provider(s): Dr. Vanderbilt, general surgery  Risk Drugs:Prescription drug management Treatment: Transfer to [facility] for likely operative intervention  Disposition: ED ED transfer to [facility] for likely operative intervention  MDM generated using voice dictation software and may contain dictation errors.  Please contact me for any clarification or with any questions.  Clinical Impression:  1. Abdominal pain, unspecified abdominal location      Admit   Final Clinical Impression(s) / ED Diagnoses Final diagnoses:  Abdominal pain, unspecified abdominal location    Rx / DC Orders ED Discharge Orders          Ordered    lidocaine  (LIDODERM ) 5 %  Every 24 hours        05/30/24 1112    acetaminophen  (TYLENOL ) 500 MG tablet  Every 6 hours PRN        05/30/24 1112    ibuprofen  (ADVIL ) 600 MG tablet  3 times daily        05/30/24 1112    polyethylene glycol (MIRALAX  / GLYCOLAX ) 17 g packet  Daily PRN        05/30/24 1112    simethicone  (MYLICON) 80 MG chewable tablet  Every 6 hours PRN        05/30/24 1112    celecoxib  (CELEBREX ) 100 MG capsule  2 times daily        05/30/24 1112             Rogelia Jerilynn RAMAN, MD 06/02/24 1407

## 2024-05-28 NOTE — H&P (Signed)
 Leslie Gilbert 14-Sep-1995  [id number].    Requesting MD: Rogelia, MD Chief Complaint/Reason for Consult: cholecystiits  HPI:  Leslie Gilbert is a 28 y/o F with PMH polysubstance abuse in remission who presents with abdominal pain. Reports RUQ abdominal pain that started 2 days ago. Pain is sharp with radiation to her back. She denies similar pain in the past. Exacerbated by PO intake. Associated symptoms include nausea, vomiting, and poor PO intake. She denies fever, chills, hematemesis, diarrhea, constipation, melena, or hematochezia. Surgical history significant for umbilical hernia repair x2 as a child. She has a history of substance use (cocaine , THC, tobacco) but denies use at present. [facility] is a [facility]. Has an infant daughter who is 3 months old. She politely requests for no narcotic medications, due to her drug history. She reports allergy to baby aspirin (hives) but states she has taken ibuprofen  without allergic reaction. No other known drug allergies.  ROS: Review of Systems  All other systems reviewed and are negative.   Family History  Problem Relation Age of Onset   Breast cancer Mother    Breast cancer Other     Past Medical History:  Diagnosis Date   Breast mass    Syphilis    Tx with Bicillin   x3 at [facility] / 01/2023    Past Surgical History:  Procedure Laterality Date   HERNIA REPAIR     UMBILICAL HERNIA REPAIR     age 23 or 5    Social History:  reports that she has been smoking cigarettes. She has a 3 pack-year smoking history. She has never used smokeless tobacco. She reports that she does not currently use alcohol after a past usage of about 6.0 standard drinks of alcohol per week. She reports current drug use. Drugs: Marijuana and Crack cocaine .  Allergies:  Allergies  Allergen Reactions   Asa [Aspirin] Swelling    (Not in a hospital admission)  Physical Exam: Blood pressure (!) 128/92, pulse 70, temperature 98.5 F  (36.9 C), temperature source Oral, resp. rate 20, height 5' 5 (1.651 m), weight 67 kg, SpO2 100%, unknown if currently breastfeeding. General: Pleasant female in NAD. HEENT: head -normocephalic, atraumatic; Eyes: PERRLA, no conjunctival injection; anicteric sclerae Neck- Trachea is midline CV- RRR, normal S1/S2, no M/R/G, no lower extremity edema Pulm- breathing is non-labored ORA Abd- soft, TTP RUQ, epigastric region, and RLQ - most over of RUQ. no masses or organomegaly.suspect recurrent ventral hernia. GU- deferred  MSK- UE/LE symmetrical, no cyanosis, clubbing, or edema. Neuro- CN II-XII grossly in tact, no paresthesias. Psych- Alert and Oriented x3 with appropriate affect Skin: warm and dry, no rashes or lesions   Results for orders placed or performed during the hospital encounter of 05/28/24 (from the past 48 hours)  Lipase, blood     Status: None   Collection Time: 05/28/24  8:52 AM  Result Value Ref Range   Lipase 25 11 - 51 U/L    Comment: Performed at [facility], [address], [place], [place] [address]  Comprehensive metabolic panel     Status: None   Collection Time: 05/28/24  8:52 AM  Result Value Ref Range   Sodium 140 135 - 145 mmol/L   Potassium 3.9 3.5 - 5.1 mmol/L   Chloride 104 98 - 111 mmol/L   CO2 28 22 - 32 mmol/L   Glucose, Bld 89 70 - 99 mg/dL    Comment: Glucose reference range applies  only to samples taken after fasting for at least 8 hours.   BUN 13 6 - 20 mg/dL   Creatinine, Ser 9.24 0.44 - 1.00 mg/dL   Calcium  9.5 8.9 - 10.3 mg/dL   Total Protein 7.5 6.5 - 8.1 g/dL   Albumin 4.3 3.5 - 5.0 g/dL   AST 18 15 - 41 U/L   ALT 15 0 - 44 U/L   Alkaline Phosphatase 87 38 - 126 U/L   Total Bilirubin 1.0 0.0 - 1.2 mg/dL   GFR, Estimated >39 >39 mL/min    Comment: (NOTE) Calculated using the CKD-EPI Creatinine Equation (2021)    Anion gap 8 5 - 15    Comment: Performed at [facility], [address]  [place], [place] [address]  CBC     Status: None   Collection Time: 05/28/24  8:52 AM  Result Value Ref Range   WBC 8.4 4.0 - 10.5 K/uL   RBC 4.38 3.87 - 5.11 MIL/uL   Hemoglobin 12.8 12.0 - 15.0 g/dL   HCT 61.2 63.9 - 53.9 %   MCV 88.4 80.0 - 100.0 fL   MCH 29.2 26.0 - 34.0 pg   MCHC 33.1 30.0 - 36.0 g/dL   RDW 86.5 88.4 - 84.4 %   Platelets 307 150 - 400 K/uL   nRBC 0.0 0.0 - 0.2 %    Comment: Performed at [facility], [address] [place], [place] [address]  Urinalysis, Routine w reflex microscopic -Urine, Clean Catch     Status: Abnormal   Collection Time: 05/28/24  8:52 AM  Result Value Ref Range   Color, Urine YELLOW YELLOW   APPearance CLEAR CLEAR   Specific Gravity, Urine 1.033 (H) 1.005 - 1.030   pH 6.0 5.0 - 8.0   Glucose, UA NEGATIVE NEGATIVE mg/dL   Hgb urine dipstick NEGATIVE NEGATIVE   Bilirubin Urine NEGATIVE NEGATIVE   Ketones, ur NEGATIVE NEGATIVE mg/dL   Protein, ur TRACE (A) NEGATIVE mg/dL   Nitrite NEGATIVE NEGATIVE   Leukocytes,Ua SMALL (A) NEGATIVE   RBC / HPF 0-5 0 - 5 RBC/hpf   WBC, UA 0-5 0 - 5 WBC/hpf   Bacteria, UA NONE SEEN NONE SEEN   Squamous Epithelial / HPF 0-5 0 - 5 /HPF   Mucus PRESENT     Comment: Performed at [facility], [address], [place], [place] [address]  Pregnancy, urine     Status: None   Collection Time: 05/28/24  8:52 AM  Result Value Ref Range   Preg Test, Ur NEGATIVE NEGATIVE    Comment:        THE SENSITIVITY OF THIS METHODOLOGY IS >20 mIU/mL. Performed at [facility], [address] [place], [place] [address]    US  Abdomen Limited RUQ (LIVER/GB) Result Date: 05/28/2024 CLINICAL DATA:  Postprandial right upper quadrant pain with nausea and vomiting. EXAM: ULTRASOUND ABDOMEN LIMITED RIGHT UPPER QUADRANT COMPARISON:  None Available. FINDINGS: Gallbladder: Multiple gallstones in a nondistended gallbladder. The gallbladder is diffusely inflamed and edematous with  significant diffuse wall thickening measuring up to 12 mm. The patient was tender during the exam overlying the gallbladder. Common bile duct: Diameter: 3 mm Liver: No focal lesion identified. Within normal limits in parenchymal echogenicity. Portal vein is patent on color Doppler imaging with normal direction of blood flow towards the liver. Other: No ascites or pericholecystic fluid seen. IMPRESSION: Cholelithiasis with diffuse gallbladder wall thickening and edema. Focal tenderness overlying the gallbladder. Findings are consistent with cholecystitis.  Lack of gallbladder distension may be consistent with chronic cholecystitis. Electronically Signed   By: Marcey Moan M.D.   On: 05/28/2024 11:06      Assessment/Plan Calculous cholecystitis - history, labs, and imaging have all been personally reviewed. Her abdominal pain in the RUQ with radiation to her back is classic for gallbladder disease. She is hemodynamically stable. No leukocytosis. Normal LFTs. RUQ U/S shows cholelithiasis with gallbladder wall thickening and edema. She is TTP in RUQ on exam. All of these findings are consistent with cholecystitis.   Recommend proceeding with laparoscopic cholecystectomy. Rocephin  and ICG ordered.  The operative and non-operative management of cholecystitis was discussed with the patient. Risks of surgery including bleeding, infection, damage to surrounding structures, conversion to open, drain placement, need for additional procedures, prolonged hospital stay, as well as the risks of general anesthesia were discussed with the patient and she would like to proceed with surgery. Questions were welcomed and answered.    FEN -Reg diet,NPO MN for surgery tomorrow, IVF @ 50 mL/hr VTE - SCD's, Lovenox ID - Rocephin  Admit - [facility] service, observation   I reviewed nursing notes, ED provider notes, last 24 h vitals and pain scores, last 48 h intake and output, last 24 h labs and trends, and last 24 h imaging  results.  Almarie GORMAN Pringle, PA-C [facility] 05/28/2024, 12:56 PM Please see Amion for pager number during day hours 7:00am-4:30pm or 7:00am -11:30am on weekends

## 2024-05-28 NOTE — Plan of Care (Signed)
  Problem: Education: Goal: Knowledge of General Education information will improve Description: Including pain rating scale, medication(s)/side effects and non-pharmacologic comfort measures 05/28/2024 1859 by Stewart Chauncey HERO, RN Outcome: Progressing 05/28/2024 1858 by Stewart Chauncey HERO, RN Outcome: Not Progressing   Problem: Health Behavior/Discharge Planning: Goal: Ability to manage health-related needs will improve 05/28/2024 1859 by Stewart Chauncey HERO, RN Outcome: Progressing 05/28/2024 1858 by Stewart Chauncey HERO, RN Outcome: Not Progressing   Problem: Clinical Measurements: Goal: Ability to maintain clinical measurements within normal limits will improve 05/28/2024 1859 by Stewart Chauncey HERO, RN Outcome: Progressing 05/28/2024 1858 by Stewart Chauncey HERO, RN Outcome: Not Progressing Goal: Will remain free from infection 05/28/2024 1859 by Stewart Chauncey HERO, RN Outcome: Progressing 05/28/2024 1858 by Stewart Chauncey HERO, RN Outcome: Not Progressing Goal: Diagnostic test results will improve 05/28/2024 1859 by Stewart Chauncey HERO, RN Outcome: Progressing 05/28/2024 1858 by Stewart Chauncey HERO, RN Outcome: Not Progressing Goal: Respiratory complications will improve 05/28/2024 1859 by Stewart Chauncey HERO, RN Outcome: Progressing 05/28/2024 1858 by Stewart Chauncey HERO, RN Outcome: Not Progressing Goal: Cardiovascular complication will be avoided 05/28/2024 1859 by Stewart Chauncey HERO, RN Outcome: Progressing 05/28/2024 1858 by Stewart Chauncey HERO, RN Outcome: Not Progressing   Problem: Activity: Goal: Risk for activity intolerance will decrease 05/28/2024 1859 by Stewart Chauncey HERO, RN Outcome: Progressing 05/28/2024 1858 by Stewart Chauncey HERO, RN Outcome: Not Progressing   Problem: Nutrition: Goal: Adequate nutrition will be maintained 05/28/2024 1859 by Stewart Chauncey HERO, RN Outcome: Progressing 05/28/2024 1858  by Stewart Chauncey HERO, RN Outcome: Not Progressing   Problem: Coping: Goal: Level of anxiety will decrease 05/28/2024 1859 by Stewart Chauncey HERO, RN Outcome: Progressing 05/28/2024 1858 by Stewart Chauncey HERO, RN Outcome: Not Progressing   Problem: Elimination: Goal: Will not experience complications related to bowel motility 05/28/2024 1859 by Stewart Chauncey HERO, RN Outcome: Progressing 05/28/2024 1858 by Stewart Chauncey HERO, RN Outcome: Not Progressing Goal: Will not experience complications related to urinary retention 05/28/2024 1859 by Stewart Chauncey HERO, RN Outcome: Progressing 05/28/2024 1858 by Stewart Chauncey HERO, RN Outcome: Not Progressing   Problem: Pain Managment: Goal: General experience of comfort will improve and/or be controlled 05/28/2024 1859 by Stewart Chauncey HERO, RN Outcome: Progressing 05/28/2024 1858 by Stewart Chauncey HERO, RN Outcome: Not Progressing   Problem: Safety: Goal: Ability to remain free from injury will improve 05/28/2024 1859 by Stewart Chauncey HERO, RN Outcome: Progressing 05/28/2024 1858 by Stewart Chauncey HERO, RN Outcome: Not Progressing   Problem: Skin Integrity: Goal: Risk for impaired skin integrity will decrease 05/28/2024 1859 by Stewart Chauncey HERO, RN Outcome: Progressing 05/28/2024 1858 by Stewart Chauncey HERO, RN Outcome: Not Progressing

## 2024-05-28 NOTE — ED Triage Notes (Signed)
 Reports R sided ABD pain x 2 days. + n/v.

## 2024-05-28 NOTE — ED Provider Notes (Signed)
 Patient arrives at [facility] for surgical evaluation.  Surgery service is aware of patient's arrival.  Patient is comfortable in the ED while awaiting evaluation by surgery.   Laurice Maude BROCKS, MD 05/28/24 1340

## 2024-05-29 ENCOUNTER — Observation Stay (HOSPITAL_COMMUNITY): Admitting: Certified Registered Nurse Anesthetist

## 2024-05-29 ENCOUNTER — Encounter (HOSPITAL_COMMUNITY): Payer: Self-pay

## 2024-05-29 ENCOUNTER — Encounter (HOSPITAL_COMMUNITY): Admission: EM | Disposition: A | Payer: Self-pay | Source: Home / Self Care | Attending: Emergency Medicine

## 2024-05-29 HISTORY — PX: CHOLECYSTECTOMY: SHX55

## 2024-05-29 LAB — COMPREHENSIVE METABOLIC PANEL WITH GFR
ALT: 12 U/L (ref 0–44)
AST: 13 U/L — ABNORMAL LOW (ref 15–41)
Albumin: 2.9 g/dL — ABNORMAL LOW (ref 3.5–5.0)
Alkaline Phosphatase: 64 U/L (ref 38–126)
Anion gap: 9 (ref 5–15)
BUN: 10 mg/dL (ref 6–20)
CO2: 26 mmol/L (ref 22–32)
Calcium: 8.2 mg/dL — ABNORMAL LOW (ref 8.9–10.3)
Chloride: 103 mmol/L (ref 98–111)
Creatinine, Ser: 0.74 mg/dL (ref 0.44–1.00)
GFR, Estimated: >60 mL/min (ref >60)
Glucose, Bld: 87 mg/dL (ref 70–99)
Potassium: 4.1 mmol/L (ref 3.5–5.1)
Sodium: 138 mmol/L (ref 135–145)
Total Bilirubin: 0.8 mg/dL (ref 0.0–1.2)
Total Protein: 5.9 g/dL — ABNORMAL LOW (ref 6.5–8.1)

## 2024-05-29 LAB — CBC
HCT: 33.8 % — ABNORMAL LOW (ref 36.0–46.0)
Hemoglobin: 11.2 g/dL — ABNORMAL LOW (ref 12.0–15.0)
MCH: 29.5 pg (ref 26.0–34.0)
MCHC: 33.1 g/dL (ref 30.0–36.0)
MCV: 88.9 fL (ref 80.0–100.0)
Platelets: 268 K/uL (ref 150–400)
RBC: 3.8 MIL/uL — ABNORMAL LOW (ref 3.87–5.11)
RDW: 13.5 % (ref 11.5–15.5)
WBC: 9.7 K/uL (ref 4.0–10.5)
nRBC: 0 % (ref 0.0–0.2)

## 2024-05-29 SURGERY — LAPAROSCOPIC CHOLECYSTECTOMY
Anesthesia: General

## 2024-05-29 MED ORDER — LIDOCAINE 2% (20 MG/ML) 5 ML SYRINGE
INTRAMUSCULAR | Status: AC
  Filled 2024-05-29: qty 5

## 2024-05-29 MED ORDER — CHLORHEXIDINE GLUCONATE 0.12 % MT SOLN
15.0000 mL | Freq: Once | OROMUCOSAL | Status: AC
  Administered 2024-05-29: 15 mL via OROMUCOSAL

## 2024-05-29 MED ORDER — DEXAMETHASONE SOD PHOSPHATE PF 10 MG/ML IJ SOLN
INTRAMUSCULAR | Status: DC | PRN
  Administered 2024-05-29: 10 mg via INTRAVENOUS

## 2024-05-29 MED ORDER — FENTANYL CITRATE (PF) 250 MCG/5ML IJ SOLN
INTRAMUSCULAR | Status: DC | PRN
  Administered 2024-05-29 (×2): 25 ug via INTRAVENOUS
  Administered 2024-05-29: 50 ug via INTRAVENOUS
  Administered 2024-05-29: 25 ug via INTRAVENOUS

## 2024-05-29 MED ORDER — ONDANSETRON HCL 4 MG/2ML IJ SOLN
INTRAMUSCULAR | Status: AC
  Filled 2024-05-29: qty 2

## 2024-05-29 MED ORDER — SUGAMMADEX SODIUM 200 MG/2ML IV SOLN
INTRAVENOUS | Status: DC | PRN
  Administered 2024-05-29: 200 mg via INTRAVENOUS

## 2024-05-29 MED ORDER — KETOROLAC TROMETHAMINE 30 MG/ML IJ SOLN
30.0000 mg | Freq: Three times a day (TID) | INTRAMUSCULAR | Status: DC | PRN
  Administered 2024-05-30: 30 mg via INTRAVENOUS
  Filled 2024-05-29: qty 1

## 2024-05-29 MED ORDER — SODIUM CHLORIDE 0.9 % IR SOLN
Status: DC | PRN
  Administered 2024-05-29: 1000 mL

## 2024-05-29 MED ORDER — PROPOFOL 10 MG/ML IV BOLUS
INTRAVENOUS | Status: DC | PRN
  Administered 2024-05-29: 130 mg via INTRAVENOUS

## 2024-05-29 MED ORDER — ACETAMINOPHEN 500 MG PO TABS
1000.0000 mg | ORAL_TABLET | Freq: Four times a day (QID) | ORAL | Status: DC
  Administered 2024-05-29 – 2024-05-30 (×4): 1000 mg via ORAL
  Filled 2024-05-29 (×4): qty 2

## 2024-05-29 MED ORDER — LACTATED RINGERS IV SOLN: INTRAVENOUS | Status: DC

## 2024-05-29 MED ORDER — LIDOCAINE 2% (20 MG/ML) 5 ML SYRINGE
INTRAMUSCULAR | Status: AC
  Filled 2024-05-29: qty 15

## 2024-05-29 MED ORDER — PHENYLEPHRINE 80 MCG/ML (10ML) SYRINGE FOR IV PUSH (FOR BLOOD PRESSURE SUPPORT)
PREFILLED_SYRINGE | INTRAVENOUS | Status: AC
  Filled 2024-05-29: qty 20

## 2024-05-29 MED ORDER — PROPOFOL 10 MG/ML IV BOLUS
INTRAVENOUS | Status: AC
  Filled 2024-05-29: qty 20

## 2024-05-29 MED ORDER — MIDAZOLAM HCL 2 MG/2ML IJ SOLN
INTRAMUSCULAR | Status: AC
  Filled 2024-05-29: qty 2

## 2024-05-29 MED ORDER — BUPIVACAINE-EPINEPHRINE 0.25% -1:200000 IJ SOLN
INTRAMUSCULAR | Status: DC | PRN
  Administered 2024-05-29: 5 mL

## 2024-05-29 MED ORDER — FENTANYL CITRATE (PF) 250 MCG/5ML IJ SOLN
INTRAMUSCULAR | Status: AC
  Filled 2024-05-29: qty 5

## 2024-05-29 MED ORDER — ONDANSETRON HCL 4 MG/2ML IJ SOLN
INTRAMUSCULAR | Status: DC | PRN
  Administered 2024-05-29: 4 mg via INTRAVENOUS

## 2024-05-29 MED ORDER — PROPOFOL 500 MG/50ML IV EMUL
INTRAVENOUS | Status: DC | PRN
  Administered 2024-05-29: 30 ug/kg/min via INTRAVENOUS

## 2024-05-29 MED ORDER — ONDANSETRON HCL 4 MG/2ML IJ SOLN
INTRAMUSCULAR | Status: AC
  Filled 2024-05-29: qty 6

## 2024-05-29 MED ORDER — BUPIVACAINE-EPINEPHRINE (PF) 0.25% -1:200000 IJ SOLN
INTRAMUSCULAR | Status: AC
  Filled 2024-05-29: qty 30

## 2024-05-29 MED ORDER — ORAL CARE MOUTH RINSE: 15.0000 mL | Freq: Once | OROMUCOSAL | Status: AC

## 2024-05-29 MED ORDER — 0.9 % SODIUM CHLORIDE (POUR BTL) OPTIME
TOPICAL | Status: DC | PRN
  Administered 2024-05-29: 1000 mL

## 2024-05-29 MED ORDER — HYDROMORPHONE HCL 1 MG/ML IJ SOLN
0.2500 mg | INTRAMUSCULAR | Status: DC | PRN
  Administered 2024-05-29: 0.5 mg via INTRAVENOUS

## 2024-05-29 MED ORDER — MIDAZOLAM HCL (PF) 2 MG/2ML IJ SOLN
INTRAMUSCULAR | Status: DC | PRN
  Administered 2024-05-29: 1 mg via INTRAVENOUS

## 2024-05-29 MED ORDER — ROCURONIUM BROMIDE 10 MG/ML (PF) SYRINGE
PREFILLED_SYRINGE | INTRAVENOUS | Status: DC | PRN
  Administered 2024-05-29: 50 mg via INTRAVENOUS

## 2024-05-29 MED ORDER — EPHEDRINE 5 MG/ML INJ
INTRAVENOUS | Status: AC
  Filled 2024-05-29: qty 10

## 2024-05-29 MED ORDER — ENOXAPARIN SODIUM 40 MG/0.4ML IJ SOSY: 40.0000 mg | PREFILLED_SYRINGE | INTRAMUSCULAR | Status: DC

## 2024-05-29 MED ORDER — LIDOCAINE 2% (20 MG/ML) 5 ML SYRINGE
INTRAMUSCULAR | Status: DC | PRN
  Administered 2024-05-29: 60 mg via INTRAVENOUS

## 2024-05-29 MED ORDER — ROCURONIUM BROMIDE 10 MG/ML (PF) SYRINGE
PREFILLED_SYRINGE | INTRAVENOUS | Status: AC
  Filled 2024-05-29: qty 30

## 2024-05-29 MED ORDER — ROCURONIUM BROMIDE 10 MG/ML (PF) SYRINGE
PREFILLED_SYRINGE | INTRAVENOUS | Status: AC
  Filled 2024-05-29: qty 10

## 2024-05-29 MED ORDER — HYDROMORPHONE HCL 1 MG/ML IJ SOLN
INTRAMUSCULAR | Status: AC
  Filled 2024-05-29: qty 1

## 2024-05-29 MED ORDER — IBUPROFEN 600 MG PO TABS
600.0000 mg | ORAL_TABLET | Freq: Three times a day (TID) | ORAL | Status: DC
  Administered 2024-05-29 – 2024-05-30 (×3): 600 mg via ORAL
  Filled 2024-05-29: qty 3
  Filled 2024-05-29 (×2): qty 1
  Filled 2024-05-29: qty 3
  Filled 2024-05-29 (×2): qty 1
  Filled 2024-05-29: qty 3
  Filled 2024-05-29: qty 1

## 2024-05-29 MED ORDER — PROPOFOL 1000 MG/100ML IV EMUL
INTRAVENOUS | Status: AC
  Filled 2024-05-29: qty 100

## 2024-05-29 MED ORDER — CELECOXIB 100 MG PO CAPS
100.0000 mg | ORAL_CAPSULE | Freq: Two times a day (BID) | ORAL | Status: DC
  Administered 2024-05-29 – 2024-05-30 (×2): 100 mg via ORAL
  Filled 2024-05-29 (×2): qty 1

## 2024-05-29 SURGICAL SUPPLY — 32 items
BAG COUNTER SPONGE SURGICOUNT (BAG) ×1 IMPLANT
BLADE CLIPPER SURG (BLADE) IMPLANT
CANISTER SUCTION 3000ML PPV (SUCTIONS) ×1 IMPLANT
CHLORAPREP W/TINT 26 (MISCELLANEOUS) ×1 IMPLANT
CLIP APPLIE ROT 10 11.4 M/L (STAPLE) ×1 IMPLANT
COVER SURGICAL LIGHT HANDLE (MISCELLANEOUS) ×1 IMPLANT
DERMABOND ADVANCED .7 DNX12 (GAUZE/BANDAGES/DRESSINGS) ×1 IMPLANT
ELECTRODE REM PT RTRN 9FT ADLT (ELECTROSURGICAL) ×1 IMPLANT
GLOVE BIO SURGEON STRL SZ8 (GLOVE) ×1 IMPLANT
GLOVE BIOGEL PI IND STRL 8 (GLOVE) ×1 IMPLANT
GOWN STRL REUS W/ TWL LRG LVL3 (GOWN DISPOSABLE) ×2 IMPLANT
GOWN STRL REUS W/ TWL XL LVL3 (GOWN DISPOSABLE) ×1 IMPLANT
IRRIGATION SUCT STRKRFLW 2 WTP (MISCELLANEOUS) ×1 IMPLANT
KIT BASIN OR (CUSTOM PROCEDURE TRAY) ×1 IMPLANT
KIT IMAGING PINPOINTPAQ (MISCELLANEOUS) IMPLANT
KIT TURNOVER KIT B (KITS) ×1 IMPLANT
PAD ARMBOARD POSITIONER FOAM (MISCELLANEOUS) ×1 IMPLANT
POUCH RETRIEVAL ECOSAC 10 (ENDOMECHANICALS) ×1 IMPLANT
SCISSORS LAP 5X35 DISP (ENDOMECHANICALS) ×1 IMPLANT
SET TUBE SMOKE EVAC HIGH FLOW (TUBING) ×1 IMPLANT
SLEEVE Z-THREAD 5X100MM (TROCAR) ×1 IMPLANT
SOLN 0.9% NACL POUR BTL 1000ML (IV SOLUTION) ×1 IMPLANT
SOLN STERILE WATER BTL 1000 ML (IV SOLUTION) ×1 IMPLANT
SUT MNCRL AB 4-0 PS2 18 (SUTURE) ×1 IMPLANT
SUT VICRYL 0 UR6 27IN ABS (SUTURE) IMPLANT
TOWEL GREEN STERILE (TOWEL DISPOSABLE) ×1 IMPLANT
TOWEL GREEN STERILE FF (TOWEL DISPOSABLE) ×1 IMPLANT
TRAY LAPAROSCOPIC MC (CUSTOM PROCEDURE TRAY) ×1 IMPLANT
TROCAR 11X100 Z THREAD (TROCAR) ×1 IMPLANT
TROCAR BALLN 12MMX100 BLUNT (TROCAR) ×1 IMPLANT
TROCAR Z-THREAD OPTICAL 5X100M (TROCAR) ×1 IMPLANT
WARMER LAPAROSCOPE (MISCELLANEOUS) ×1 IMPLANT

## 2024-05-29 NOTE — Plan of Care (Signed)
  Problem: Pain Managment: Goal: General experience of comfort will improve and/or be controlled Outcome: Progressing   Problem: Safety: Goal: Ability to remain free from injury will improve Outcome: Progressing

## 2024-05-29 NOTE — Transfer of Care (Signed)
 Immediate Anesthesia Transfer of Care Note  Patient: Leslie Gilbert  Procedure(s) Performed: LAPAROSCOPIC CHOLECYSTECTOMY  Patient Location: PACU  Anesthesia Type:General  Level of Consciousness: drowsy  Airway & Oxygen Therapy: Patient Spontanous Breathing  Post-op Assessment: Report given to RN and Post -op Vital signs reviewed and stable  Post vital signs: Reviewed and stable  Last Vitals:  Vitals Value Taken Time  BP 139/95 05/29/24 14:52  Temp    Pulse 98 05/29/24 14:53  Resp 24 05/29/24 14:53  SpO2 99 % 05/29/24 14:53  Vitals shown include unfiled device data.  Last Pain:  Vitals:   05/29/24 1028  TempSrc: Oral  PainSc: 0-No pain         Complications: No notable events documented.

## 2024-05-29 NOTE — TOC CM/SW Note (Signed)
 TOC consult received for substance abuse. Per notes, patient is involved in the [facility]. SW to f/u with patient as appropriate.   Merilee Batty, MSN, RN Case Management [phone]

## 2024-05-29 NOTE — Interval H&P Note (Signed)
 History and Physical Interval Note:  05/29/2024 12:29 PM  Kathline M Molinelli  has presented today for surgery, with the diagnosis of cholecystitis.  The various methods of treatment have been discussed with the patient and family. After consideration of risks, benefits and other options for treatment, the patient has consented to  Procedure(s) with comments: LAPAROSCOPIC CHOLECYSTECTOMY (N/A) - WITH ICG DYE as a surgical intervention.  The patient's history has been reviewed, patient examined, no change in status, stable for surgery.  I have reviewed the patient's chart and labs.  Questions were answered to the patient's satisfaction.   The procedure has been discussed with the patient. Operative and non operative treatments have been discussed. Risks of surgery include bleeding, infection,  Common bile duct injury,  Injury to the stomach,liver, colon,small intestine, abdominal wall,  Diaphragm,  Major blood vessels,  And the need for an open procedure.  Other risks include worsening of medical problems, death,  DVT and pulmonary embolism, and cardiovascular events.   Medical options have also been discussed. The patient has been informed of long term expectations of surgery and non surgical options,  The patient agrees to proceed.     Korayma Hagwood A Howard Bunte

## 2024-05-29 NOTE — Anesthesia Procedure Notes (Signed)
 Procedure Name: Intubation Date/Time: 05/29/2024 1:26 PM  Performed by: Lamar Lucie DASEN, CRNAPre-anesthesia Checklist: Patient identified, Emergency Drugs available, Suction available and Patient being monitored Patient Re-evaluated:Patient Re-evaluated prior to induction Oxygen Delivery Method: Circle system utilized Preoxygenation: Pre-oxygenation with 100% oxygen Induction Type: IV induction Ventilation: Mask ventilation without difficulty Laryngoscope Size: Mac and 3 Grade View: Grade I Tube type: Oral Tube size: 7.0 mm Number of attempts: 1 Airway Equipment and Method: Stylet and Oral airway Placement Confirmation: ETT inserted through vocal cords under direct vision, positive ETCO2 and breath sounds checked- equal and bilateral Secured at: 21 cm Tube secured with: Tape Dental Injury: Teeth and Oropharynx as per pre-operative assessment

## 2024-05-29 NOTE — Anesthesia Postprocedure Evaluation (Signed)
 Anesthesia Post Note  Patient: Montasia M Horst  Procedure(s) Performed: LAPAROSCOPIC CHOLECYSTECTOMY     Patient location during evaluation: PACU Anesthesia Type: General Level of consciousness: awake and alert Pain management: pain level controlled Vital Signs Assessment: post-procedure vital signs reviewed and stable Respiratory status: spontaneous breathing, nonlabored ventilation and respiratory function stable Cardiovascular status: blood pressure returned to baseline and stable Postop Assessment: no apparent nausea or vomiting Anesthetic complications: no   No notable events documented.  Last Vitals:  Vitals:   05/29/24 1515 05/29/24 1530  BP: (!) 131/92 (!) 136/95  Pulse: 82 76  Resp: 19 18  Temp:  36.8 C  SpO2: 99% 98%    Last Pain:  Vitals:   05/29/24 1530  TempSrc:   PainSc: 5                  Bayard More,W. EDMOND

## 2024-05-29 NOTE — Anesthesia Preprocedure Evaluation (Addendum)
 Anesthesia Evaluation  Patient identified by MRN, date of birth, ID band Patient awake    Reviewed: Allergy & Precautions, H&P , NPO status , Patient's Chart, lab work & pertinent test results  Airway Mallampati: II  TM Distance: >3 FB Neck ROM: Full    Dental no notable dental hx. (+) Teeth Intact, Dental Advisory Given   Pulmonary former smoker   Pulmonary exam normal breath sounds clear to auscultation       Cardiovascular negative cardio ROS  Rhythm:Regular Rate:Normal     Neuro/Psych negative neurological ROS  negative psych ROS   GI/Hepatic negative GI ROS, Neg liver ROS,,,  Endo/Other  negative endocrine ROS    Renal/GU negative Renal ROS  negative genitourinary   Musculoskeletal   Abdominal   Peds  Hematology negative hematology ROS (+)   Anesthesia Other Findings   Reproductive/Obstetrics negative OB ROS                              Anesthesia Physical Anesthesia Plan  ASA: 1  Anesthesia Plan: General   Post-op Pain Management: Tylenol  PO (pre-op)*   Induction: Intravenous  PONV Risk Score and Plan: 4 or greater and Ondansetron , Dexamethasone and Midazolam  Airway Management Planned: Oral ETT  Additional Equipment:   Intra-op Plan:   Post-operative Plan: Extubation in OR  Informed Consent: I have reviewed the patients History and Physical, chart, labs and discussed the procedure including the risks, benefits and alternatives for the proposed anesthesia with the patient or authorized representative who has indicated his/her understanding and acceptance.     Dental advisory given  Plan Discussed with: CRNA  Anesthesia Plan Comments:          Anesthesia Quick Evaluation

## 2024-05-29 NOTE — Plan of Care (Signed)

## 2024-05-29 NOTE — Discharge Instructions (Signed)

## 2024-05-29 NOTE — Op Note (Signed)
 Preoperative diagnosis: Acute cholecystitis  Postoperative diagnosis: Same  Procedure: Laparoscopic cholecystectomy with ICG dye  Surgeon: Debby Shipper, MD  Assistant: Dr. Marolyn Big, MD  I was personally present during the key and critical portions of this procedure and immediately available throughout the entire procedure, as documented in my operative note.   Anesthesia: General With 0.25% Marcaine  with epinephrine  EBL: 40 cc  Drains: None  Specimen: Gallbladder to pathology  Indications for procedure: Patient presents for laparoscopic cholecystectomy for acute cholecystitis.The procedure has been discussed with the patient. Operative and non operative treatments have been discussed. Risks of surgery include bleeding, infection,  Common bile duct injury,  Injury to the stomach,liver, colon,small intestine, abdominal wall,  Diaphragm,  Major blood vessels,  And the need for an open procedure.  Other risks include worsening of medical problems, death,  DVT and pulmonary embolism, and cardiovascular events.   Medical options have also been discussed. The patient has been informed of long term expectations of surgery and non surgical options,  The patient agrees to proceed.      Description of procedure: The patient was met in the holding area questions were answered.  She was then brought the op room placed supine upon the operating table.  After induction under general anesthesia, the abdomen is prepped and draped in sterile fashion and timeout performed.  She the patient was already on antibiotics for acute cholecystitis.  A 1 cm infraumbilical incision was made.  Dissection was carried to the fascia and the fascia was opened the midline.  A pursestring suture of 0 Vicryl was placed at a 12 mm balloontipped catheter was placed.  Pneumoperitoneum was created to 15 mmHg CO2 pressure.  She had some omental adhesions.  I was able to place an 11 mm subxiphoid port and 2 right upper  quadrant 5 mm ports under direct vision.  These used to take the omental adhesions down from the midline.  There is good hemostasis and no evidence of injury to internal viscera with this.  We then focused on the gallbladder.  She had signs of acute cholecystitis.  The dome was grabbed and retracted toward the patient's right shoulder.  The infundibulum was grasped and pulled toward the right lower quadrant which expose the triangle of Callot.  ICG dye fluoroscopy was used we could see the cystic duct and common duct junction clearly.  Once the critical view was obtained the cystic artery was also dissected out.  Due to the poor tissue quality of the cystic duct, cholangiogram was not performed.  I opted to clip this with 3 clips and divided the cystic duct.  The cystic artery was noted divided tween clips.  Hook cautery was used to dissect the gallbladder off the gallbladder fossa.  The posterior cystic artery was notified and controlled the clip.  The gallbladder was removed.  He was placed into a pouch.  The gallbladder bed was irrigated made hemostatic with cautery.  Irrigation was used until clear.  The gallbladder was then extracted out the umbilical port.  This was closed with the pursestring suture already in place.  Reexamination showed good hemostasis without bile leakage.  Excess irrigation was suctioned out was clear.  Four-quadrant laparoscopy revealed no evidence of internal injury from the procedure.  Ports were then removed allowing the CO2 to escape.  4 Monocryl was used to close the skin in a subcuticular fashion.  Dermabond was applied.  All counts were found to be correct.  The patient was  awoke extubated taken to recovery in satisfactory condition.

## 2024-05-30 ENCOUNTER — Encounter (HOSPITAL_COMMUNITY): Payer: Self-pay | Admitting: Surgery

## 2024-05-30 MED ORDER — CELECOXIB 100 MG PO CAPS: 100.0000 mg | ORAL_CAPSULE | Freq: Two times a day (BID) | ORAL | 0 refills | Status: AC

## 2024-05-30 MED ORDER — ALUM & MAG HYDROXIDE-SIMETH 200-200-20 MG/5ML PO SUSP
30.0000 mL | ORAL | Status: DC | PRN
  Administered 2024-05-30 (×2): 30 mL via ORAL
  Filled 2024-05-30 (×2): qty 30

## 2024-05-30 MED ORDER — SIMETHICONE 80 MG PO CHEW: 80.0000 mg | CHEWABLE_TABLET | Freq: Four times a day (QID) | ORAL | 0 refills | Status: AC | PRN

## 2024-05-30 MED ORDER — LIDOCAINE 5 % EX PTCH
1.0000 | MEDICATED_PATCH | CUTANEOUS | Status: DC
  Administered 2024-05-30: 1 via TRANSDERMAL
  Filled 2024-05-30: qty 1

## 2024-05-30 MED ORDER — IBUPROFEN 600 MG PO TABS: 600.0000 mg | ORAL_TABLET | Freq: Three times a day (TID) | ORAL | 0 refills | Status: DC

## 2024-05-30 MED ORDER — ACETAMINOPHEN 500 MG PO TABS: 1000.0000 mg | ORAL_TABLET | Freq: Four times a day (QID) | ORAL | Status: DC | PRN

## 2024-05-30 MED ORDER — POLYETHYLENE GLYCOL 3350 17 G PO PACK: 17.0000 g | PACK | Freq: Every day | ORAL | Status: AC | PRN

## 2024-05-30 MED ORDER — SIMETHICONE 80 MG PO CHEW
80.0000 mg | CHEWABLE_TABLET | Freq: Four times a day (QID) | ORAL | Status: DC | PRN
  Administered 2024-05-30: 80 mg via ORAL
  Filled 2024-05-30: qty 1

## 2024-05-30 MED ORDER — LIDOCAINE 5 % EX PTCH: 1.0000 | MEDICATED_PATCH | CUTANEOUS | 0 refills | Status: AC

## 2024-05-30 NOTE — Discharge Summary (Signed)
 [facility] Discharge Summary   Patient ID: Leslie Gilbert MRN: [id number] DOB/AGE: [birth year] 28 y.o.  Admit date: 05/28/2024 Discharge date: 05/30/2024  Admitting Diagnosis: Acute cholecystitis   Discharge Diagnosis S/P laparoscopic cholecystectomy  Consultants None   Imaging: No results found.  Procedures Dr. Debby Cornett (05/29/24) - Laparoscopic Cholecystectomy with ICG dye  Hospital Course:  Patient is a 28 year old female who presented to the ED with abdominal pain.  Workup showed acute cholecystitis.  Patient was admitted and underwent procedure listed above.  Tolerated procedure well and was transferred to the floor.  Diet was advanced as tolerated.  On POD1, the patient was voiding well, tolerating diet, ambulating well, pain well controlled, vital signs stable, incisions c/d/i and felt stable for discharge home.  Patient will follow up in our office in 3-4 weeks and knows to call with questions or concerns.  I or a member of my team have reviewed this patient in the Controlled Substance Database.   Allergies as of 05/30/2024       Reactions   Asa [aspirin] Swelling        Medication List     TAKE these medications    acetaminophen  500 MG tablet Commonly known as: TYLENOL  Take 2 tablets (1,000 mg total) by mouth every 6 (six) hours as needed for mild pain (pain score 1-3), fever or headache.   celecoxib  100 MG capsule Commonly known as: CeleBREX  Take 1 capsule (100 mg total) by mouth 2 (two) times daily for 10 days.   ibuprofen  600 MG tablet Commonly known as: ADVIL  Take 1 tablet (600 mg total) by mouth 3 (three) times daily.   lidocaine  5 % Commonly known as: LIDODERM  Place 1 patch onto the skin daily. Remove & Discard patch within 12 hours or as directed by MD Start taking on: May 31, 2024   polyethylene glycol 17 g packet Commonly known as: MIRALAX  / GLYCOLAX  Take 17 g by mouth daily as needed for mild constipation.    simethicone  80 MG chewable tablet Commonly known as: MYLICON Chew 1 tablet (80 mg total) by mouth every 6 (six) hours as needed for flatulence.          Follow-up Information     Gilbert, Leslie Gosai, PA-C Follow up on 06/24/2024.   Specialty: General Surgery Why: 11:00am, Arrive 30 minutes prior to your appointment time, Please bring your insurance card and photo ID Contact information: [address] [place] [address] [facility] [place] [address] [phone]                 Signed: Burnard JONELLE Gilbert , [facility] 05/30/2024, 11:12 AM Please see Amion for pager number during day hours 7:00am-4:30pm

## 2024-05-30 NOTE — Progress Notes (Addendum)
 Transition of Care [facility]) - Inpatient Brief Assessment   Patient Details  Name: KEYAIRRA KOLINSKI MRN: [id number] Date of Birth: Mar 24, 1996  Transition of Care [facility]) CM/SW Contact:    Rosaline JONELLE Joe, RN Phone Number: 05/30/2024, 9:13 AM   Clinical Narrative: Patient admitted for acute calculous cholecystitis and is S/P cholecystectomy.  No IP Care management needs at this time.  Per [facility] note - patient will likely discharge home later today.  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  Patient plans to return to the facility when stable for discharge.  [facility] facility will provide transportation back to the facility and follow up appointments.  Patient requested medications be called into [facility] in [place] as her local pharmacy.  No other IP Care management needs.   Transition of Care Asessment: Insurance and Status: (P) Insurance coverage has been reviewed Patient has primary care physician: (P) Yes Home environment has been reviewed: (P) from home Prior level of function:: (P) self Prior/Current Home Services: (P) No current home services Social Drivers of Health Review: (P) SDOH reviewed no interventions necessary Readmission risk has been reviewed: (P) Yes Transition of care needs: (P) no transition of care needs at this time

## 2024-05-30 NOTE — Plan of Care (Signed)

## 2024-05-30 NOTE — Progress Notes (Signed)
 Discharge education provided, diet modifications education given.

## 2024-05-30 NOTE — Progress Notes (Signed)
 Progress Note  1 Day Post-Op  Subjective: Pt reports mild bloating but is passing a little bit of flatus. Denies nausea or vomiting but does feel like she has some pressure from gas in upper abdomen and radiating to chest.   Objective: Vital signs in last 24 hours: Temp:  [97.9 F (36.6 C)-99.1 F (37.3 C)] 98.5 F (36.9 C) (12/05 0834) Pulse Rate:  [64-98] 72 (12/05 0834) Resp:  [17-20] 20 (12/05 0834) BP: (100-141)/(63-97) 120/72 (12/05 0834) SpO2:  [97 %-100 %] 99 % (12/05 0834) Last BM Date : 05/27/24  Intake/Output from previous day: 12/04 0701 - 12/05 0700 In: 2996.2 [I.V.:2996.2] Out: 20 [Blood:20] Intake/Output this shift: No intake/output data recorded.  PE: General: pleasant, WD, WN female who is laying in bed in NAD HEENT: sclera anicteric Heart: regular, rate, and rhythm.   Lungs: Respiratory effort nonlabored Abd: soft, appropriately ttp, mild distention, incisions C/D/I Psych: A&Ox3 with an appropriate affect.    Lab Results:  Recent Labs    05/28/24 1826 05/29/24 0330  WBC 10.0 9.7  HGB 11.7* 11.2*  HCT 34.8* 33.8*  PLT 256 268   BMET Recent Labs    05/28/24 0852 05/28/24 1826 05/29/24 0330  NA 140  --  138  K 3.9  --  4.1  CL 104  --  103  CO2 28  --  26  GLUCOSE 89  --  87  BUN 13  --  10  CREATININE 0.75 0.75 0.74  CALCIUM  9.5  --  8.2*   PT/INR No results for input(s): LABPROT, INR in the last 72 hours. CMP     Component Value Date/Time   NA 138 05/29/2024 0330   NA 136 07/18/2023 1541   NA 144 (H) 02/17/2012 0040   K 4.1 05/29/2024 0330   K 4.0 02/17/2012 0040   CL 103 05/29/2024 0330   CL 110 (H) 02/17/2012 0040   CO2 26 05/29/2024 0330   CO2 28 (H) 02/17/2012 0040   GLUCOSE 87 05/29/2024 0330   GLUCOSE 90 02/17/2012 0040   BUN 10 05/29/2024 0330   BUN 9 07/18/2023 1541   BUN 13 02/17/2012 0040   CREATININE 0.74 05/29/2024 0330   CREATININE 0.71 02/17/2012 0040   CALCIUM  8.2 (L) 05/29/2024 0330   CALCIUM  9.1  02/17/2012 0040   PROT 5.9 (L) 05/29/2024 0330   PROT 6.0 07/18/2023 1541   PROT 8.0 02/17/2012 0040   ALBUMIN 2.9 (L) 05/29/2024 0330   ALBUMIN 3.3 (L) 07/18/2023 1541   ALBUMIN 3.7 (L) 02/17/2012 0040   AST 13 (L) 05/29/2024 0330   AST 24 02/17/2012 0040   ALT 12 05/29/2024 0330   ALT 19 02/17/2012 0040   ALKPHOS 64 05/29/2024 0330   ALKPHOS 87 02/17/2012 0040   BILITOT 0.8 05/29/2024 0330   BILITOT 0.2 07/18/2023 1541   BILITOT 0.3 02/17/2012 0040   GFRNONAA >60 05/29/2024 0330   GFRAA >60 07/09/2019 2245   Lipase     Component Value Date/Time   LIPASE 25 05/28/2024 0852       Studies/Results: US  Abdomen Limited RUQ (LIVER/GB) Result Date: 05/28/2024 CLINICAL DATA:  Postprandial right upper quadrant pain with nausea and vomiting. EXAM: ULTRASOUND ABDOMEN LIMITED RIGHT UPPER QUADRANT COMPARISON:  None Available. FINDINGS: Gallbladder: Multiple gallstones in a nondistended gallbladder. The gallbladder is diffusely inflamed and edematous with significant diffuse wall thickening measuring up to 12 mm. The patient was tender during the exam overlying the gallbladder. Common bile duct: Diameter: 3 mm Liver:  No focal lesion identified. Within normal limits in parenchymal echogenicity. Portal vein is patent on color Doppler imaging with normal direction of blood flow towards the liver. Other: No ascites or pericholecystic fluid seen. IMPRESSION: Cholelithiasis with diffuse gallbladder wall thickening and edema. Focal tenderness overlying the gallbladder. Findings are consistent with cholecystitis. Lack of gallbladder distension may be consistent with chronic cholecystitis. Electronically Signed   By: Marcey Moan M.D.   On: 05/28/2024 11:06    Anti-infectives: Anti-infectives (From admission, onward)    Start     Dose/Rate Route Frequency Ordered Stop   05/28/24 1430  cefTRIAXone  (ROCEPHIN ) 2 g in sodium chloride  0.9 % 100 mL IVPB  Status:  Discontinued        2 g 200 mL/hr over  30 Minutes Intravenous Every 24 hours 05/28/24 1415 05/29/24 1549        Assessment/Plan  Acute cholecystitis  POD1 s/p laparoscopic cholecystectomy Dr. Vanderbilt - mild pain and bloating as expected - encouraged ambulation this AM - if she is feeling ok after lunch will DC home  - diet as tolerated   FEN: reg diet VTE: LMWH ID: rocephin  12/3>12/4    LOS: 0 days     Burnard JONELLE Louder, [facility] 05/30/2024, 8:55 AM Please see Amion for pager number during day hours 7:00am-4:30pm

## 2024-06-02 LAB — SURGICAL PATHOLOGY

## 2024-06-08 ENCOUNTER — Emergency Department (HOSPITAL_COMMUNITY)

## 2024-06-08 ENCOUNTER — Emergency Department (HOSPITAL_COMMUNITY)
Admission: EM | Admit: 2024-06-08 | Discharge: 2024-06-08 | Disposition: A | Discharge status: Home / Self Care | Attending: Emergency Medicine | Admitting: Emergency Medicine

## 2024-06-08 ENCOUNTER — Encounter (HOSPITAL_COMMUNITY): Payer: Self-pay

## 2024-06-08 ENCOUNTER — Other Ambulatory Visit: Payer: Self-pay

## 2024-06-08 DIAGNOSIS — R1011 Right upper quadrant pain: Secondary | ICD-10-CM

## 2024-06-08 DIAGNOSIS — R7989 Other specified abnormal findings of blood chemistry: Secondary | ICD-10-CM

## 2024-06-08 DIAGNOSIS — K5900 Constipation, unspecified: Secondary | ICD-10-CM

## 2024-06-08 LAB — COMPREHENSIVE METABOLIC PANEL WITH GFR
ALT: 109 U/L — ABNORMAL HIGH (ref 0–44)
AST: 172 U/L — ABNORMAL HIGH (ref 15–41)
Albumin: 3.4 g/dL — ABNORMAL LOW (ref 3.5–5.0)
Alkaline Phosphatase: 139 U/L — ABNORMAL HIGH (ref 38–126)
Anion gap: 9 (ref 5–15)
BUN: 18 mg/dL (ref 6–20)
CO2: 26 mmol/L (ref 22–32)
Calcium: 8.9 mg/dL (ref 8.9–10.3)
Chloride: 104 mmol/L (ref 98–111)
Creatinine, Ser: 0.8 mg/dL (ref 0.44–1.00)
GFR, Estimated: >60 mL/min (ref >60)
Glucose, Bld: 112 mg/dL — ABNORMAL HIGH (ref 70–99)
Potassium: 3.9 mmol/L (ref 3.5–5.1)
Sodium: 139 mmol/L (ref 135–145)
Total Bilirubin: 0.7 mg/dL (ref 0.0–1.2)
Total Protein: 6.9 g/dL (ref 6.5–8.1)

## 2024-06-08 LAB — HCG, SERUM, QUALITATIVE: Preg, Serum: NEGATIVE

## 2024-06-08 LAB — URINALYSIS, ROUTINE W REFLEX MICROSCOPIC
Bilirubin Urine: NEGATIVE
Glucose, UA: NEGATIVE mg/dL
Ketones, ur: NEGATIVE mg/dL
Nitrite: NEGATIVE
Protein, ur: 100 mg/dL — AB
Specific Gravity, Urine: 1.03 (ref 1.005–1.030)
pH: 5 (ref 5.0–8.0)

## 2024-06-08 LAB — CBC
HCT: 39.5 % (ref 36.0–46.0)
Hemoglobin: 13 g/dL (ref 12.0–15.0)
MCH: 29.3 pg (ref 26.0–34.0)
MCHC: 32.9 g/dL (ref 30.0–36.0)
MCV: 89 fL (ref 80.0–100.0)
Platelets: 381 K/uL (ref 150–400)
RBC: 4.44 MIL/uL (ref 3.87–5.11)
RDW: 12.7 % (ref 11.5–15.5)
WBC: 6.7 K/uL (ref 4.0–10.5)
nRBC: 0 % (ref 0.0–0.2)

## 2024-06-08 LAB — LIPASE, BLOOD: Lipase: 27 U/L (ref 11–51)

## 2024-06-08 MED ORDER — IOHEXOL 350 MG/ML SOLN
75.0000 mL | Freq: Once | INTRAVENOUS | Status: AC | PRN
  Administered 2024-06-08: 75 mL via INTRAVENOUS

## 2024-06-08 MED ORDER — IBUPROFEN 600 MG PO TABS: 600.0000 mg | ORAL_TABLET | Freq: Four times a day (QID) | ORAL | 0 refills | Status: DC | PRN

## 2024-06-08 MED ORDER — KETOROLAC TROMETHAMINE 15 MG/ML IJ SOLN
15.0000 mg | Freq: Once | INTRAMUSCULAR | Status: AC
  Administered 2024-06-08: 15 mg via INTRAVENOUS
  Filled 2024-06-08: qty 1

## 2024-06-08 MED ORDER — ACETAMINOPHEN 10 MG/ML IV SOLN
1000.0000 mg | Freq: Four times a day (QID) | INTRAVENOUS | Status: DC
  Administered 2024-06-08: 1000 mg via INTRAVENOUS

## 2024-06-08 MED ORDER — ACETAMINOPHEN 10 MG/ML IV SOLN
1000.0000 mg | Freq: Four times a day (QID) | INTRAVENOUS | Status: DC
  Administered 2024-06-08: 1000 mg via INTRAVENOUS
  Filled 2024-06-08 (×2): qty 100

## 2024-06-08 MED ORDER — BISACODYL 5 MG PO TBEC
5.0000 mg | DELAYED_RELEASE_TABLET | Freq: Once | ORAL | Status: AC
  Administered 2024-06-08: 5 mg via ORAL
  Filled 2024-06-08: qty 1

## 2024-06-08 MED ORDER — BISACODYL 5 MG PO TBEC: 10.0000 mg | DELAYED_RELEASE_TABLET | Freq: Two times a day (BID) | ORAL | 0 refills | Status: AC

## 2024-06-08 MED ORDER — ONDANSETRON HCL 4 MG/2ML IJ SOLN
4.0000 mg | Freq: Once | INTRAMUSCULAR | Status: AC
  Administered 2024-06-08: 4 mg via INTRAVENOUS
  Filled 2024-06-08: qty 2

## 2024-06-08 MED ORDER — ACETAMINOPHEN 500 MG PO TABS: 1000.0000 mg | ORAL_TABLET | Freq: Four times a day (QID) | ORAL | 0 refills | Status: AC | PRN

## 2024-06-08 NOTE — ED Provider Triage Note (Signed)
 Emergency Medicine Provider Triage Evaluation Note  Leslie Gilbert , a 28 y.o. female  was evaluated in triage.  Pt complains of right sided abdominal pain. S/p 10 days lap chole.  Associated nausea.  No fever.  Not taking pain meds anymore.  Review of Systems  Positive: Abdominal pain Negative: fever  Physical Exam  BP 126/79 (BP Location: Right Arm)   Pulse 90   Temp 98.5 F (36.9 C)   Resp 18   Ht 5' 5 (1.651 m)   Wt 66.7 kg   LMP 06/08/2024   SpO2 100%   BMI 24.46 kg/m  Gen:   Awake, no distress   Resp:  Normal effort  MSK:   Moves extremities without difficulty  Other:  Right sided abdominal tenderness  Medical Decision Making  Medically screening exam initiated at 2:25 AM.  Appropriate orders placed.  Leslie Gilbert was informed that the remainder of the evaluation will be completed by another provider, this initial triage assessment does not replace that evaluation, and the importance of remaining in the ED until their evaluation is complete.     Vicky Charleston, PA-C 06/08/24 9773

## 2024-06-08 NOTE — ED Notes (Signed)
 Pt to ED c/o abdominal pain , right side x 1 day, post surgical pt, lap cholye 10 days ago.

## 2024-06-08 NOTE — ED Provider Notes (Signed)
 [place] EMERGENCY DEPARTMENT AT [facility] Provider Note   CSN: [id number] Arrival date & time: 06/08/24  0145     Patient presents with: Abdominal Pain   Leslie Gilbert is a 28 y.o. 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  Status post cholecystectomy 9 days ago.  Patient reports that she was doing fine, had no longer been taking any pain medication, she has been taking ibuprofen , stool softener, Tylenol , but reports that suddenly within last 24 hours pain significantly worsens.    Abdominal Pain      Prior to Admission medications  Medication Sig Start Date End Date Taking? Authorizing Provider  acetaminophen  (TYLENOL ) 500 MG tablet Take 2 tablets (1,000 mg total) by mouth every 6 (six) hours as needed. 06/08/24  Yes Thatiana Renbarger H, PA-C  bisacodyl  (DULCOLAX) 5 MG EC tablet Take 2 tablets (10 mg total) by mouth 2 (two) times daily. 06/08/24  Yes Niki Cosman H, PA-C  ibuprofen  (ADVIL ) 600 MG tablet Take 1 tablet (600 mg total) by mouth every 6 (six) hours as needed. 06/08/24  Yes Denese Mentink H, PA-C  celecoxib  (CELEBREX ) 100 MG capsule Take 1 capsule (100 mg total) by mouth 2 (two) times daily for 10 days. 05/30/24 06/09/24  Vicci Burnard SAUNDERS, PA-C  lidocaine  (LIDODERM ) 5 % Place 1 patch onto the skin daily. Remove & Discard patch within 12 hours or as directed by MD 05/31/24   Vicci Burnard SAUNDERS, PA-C  polyethylene glycol (MIRALAX  / GLYCOLAX ) 17 g packet Take 17 g by mouth daily as needed for mild constipation. 05/30/24   Vicci Burnard SAUNDERS, PA-C  simethicone  (MYLICON) 80 MG chewable tablet Chew 1 tablet (80 mg total) by mouth every 6 (six) hours as needed for flatulence. 05/30/24   Vicci Burnard SAUNDERS, PA-C    Allergies: Asa [aspirin]    Review of Systems  Gastrointestinal:  Positive for abdominal pain.   All other systems reviewed and are negative.   Updated Vital Signs BP 117/79   Pulse 62   Temp 98.3 F (36.8 C)   Resp 17   Ht 5' 5 (1.651 m)   Wt 66.7 kg   LMP 06/08/2024   SpO2 100%   BMI 24.46 kg/m   Physical Exam Vitals and nursing note reviewed.  Constitutional:      General: She is not in acute distress.    Appearance: Normal appearance.  HENT:     Head: Normocephalic and atraumatic.  Eyes:     General:        Right eye: No discharge.        Left eye: No discharge.  Cardiovascular:     Rate and Rhythm: Normal rate and regular rhythm.     Heart sounds: No murmur heard.    No friction rub. No gallop.  Pulmonary:     Effort: Pulmonary effort is normal.     Breath sounds: Normal breath sounds.  Abdominal:     General: Bowel sounds are normal.     Palpations: Abdomen is soft.     Comments: Focal tenderness to palpation in the RUQ, no rebound, rigidity, some guarding  Skin:    General: Skin is warm and dry.     Capillary Refill: Capillary refill takes less than 2 seconds.  Neurological:     Mental Status: She is alert and oriented to person, place,  and time.  Psychiatric:        Mood and Affect: Mood normal.        Behavior: Behavior normal.     (all labs ordered are listed, but only abnormal results are displayed) Labs Reviewed  COMPREHENSIVE METABOLIC PANEL WITH GFR - Abnormal; Notable for the following components:      Result Value   Glucose, Bld 112 (*)    Albumin 3.4 (*)    AST 172 (*)    ALT 109 (*)    Alkaline Phosphatase 139 (*)    All other components within normal limits  URINALYSIS, ROUTINE W REFLEX MICROSCOPIC - Abnormal; Notable for the following components:   APPearance CLOUDY (*)    Hgb urine dipstick LARGE (*)    Protein, ur 100 (*)    Leukocytes,Ua SMALL (*)    Bacteria, UA RARE (*)    All other components within normal limits  LIPASE, BLOOD  CBC  HCG, SERUM, QUALITATIVE    EKG: None  Radiology: US  Abdomen Limited RUQ  (LIVER/GB) Result Date: 06/08/2024 EXAM: Right Upper Quadrant Abdominal Ultrasound 06/08/2024 01:14:00 PM TECHNIQUE: Real-time ultrasonography of the right upper quadrant of the abdomen was performed. COMPARISON: US  Abdomen 05/28/2024. CLINICAL HISTORY: Right quadrant abdominal pain, cholecystectomy 10 days ago. FINDINGS: LIVER: The liver demonstrates normal echogenicity. Liver length measures 17.2 cm. No intrahepatic biliary ductal dilatation. No evidence of mass. BILIARY SYSTEM: No pericholecystic fluid or wall thickening. No cholelithiasis. Common bile duct is within normal limits measuring 4.1 mm. OTHER: No right upper quadrant ascites. IMPRESSION: 1. No acute abnormality identified in the right upper quadrant after recent cholecystectomy. Electronically signed by: Morgane Naveau MD 06/08/2024 01:39 PM EST RP Workstation: [id number]   CT ABDOMEN PELVIS W CONTRAST Result Date: 06/08/2024 EXAM: CT ABDOMEN AND PELVIS WITH CONTRAST 06/08/2024 03:53:37 AM TECHNIQUE: CT of the abdomen and pelvis was performed with the administration of 75 mL of iohexol  (OMNIPAQUE ) 350 MG/ML injection. Multiplanar reformatted images are provided for review. Automated exposure control, iterative reconstruction, and/or weight-based adjustment of the mA/kV was utilized to reduce the radiation dose to as low as reasonably achievable. COMPARISON: Abdomen ultrasound 05/28/2024. CLINICAL HISTORY: 28 year old female with abdominal pain, postoperative status post laparoscopic cholecystectomy approximately 10 days ago. FINDINGS: LOWER CHEST: No acute abnormality. LIVER: Liver enhancement within normal limits. No perihepatic free fluid. GALLBLADDER AND BILE DUCTS: Surgical clips are present at the gallbladder fossa with trace surrounding low to intermediate fluid density (series 3 image 32), probably representing a small postoperative seroma. No regional inflammation. No biliary ductal dilatation. SPLEEN: No acute abnormality. PANCREAS: No  acute abnormality. ADRENAL GLANDS: No acute abnormality. KIDNEYS, URETERS AND BLADDER: No stones in the kidneys or ureters. No hydronephrosis. No perinephric or periureteral stranding. Urinary bladder is unremarkable. GI AND BOWEL: Stomach and duodenum decompressed. Numerous fluid containing but nondilated small bowel loops including in the pelvis. Mild to moderate stool ball in the rectum. Upstream sigmoid colon decompressed. Redundant junction of the descending and sigmoid colon in the left lower quadrant with retained gas and stool. Moderate descending and transverse colon retained stool. Negative right colon and normal gas containing appendix (coronal images 43 and 46). There is no bowel obstruction. PERITONEUM AND RETROPERITONEUM: No free air, free fluid, or mesenteric inflammation identified. VASCULATURE: Aorta is normal in caliber. LYMPH NODES: No lymphadenopathy. REPRODUCTIVE ORGANS: No acute abnormality. BONES AND SOFT TISSUES: Recent postoperative changes to the ventral abdominal wall, most conspicuous at the umbilicus. No adverse features identified. No acute osseous abnormality.  IMPRESSION: 1. Recent laparoscopic cholecystectomy with no adverse features. Trace seroma suspected at the gallbladder fossa (series 3 image 32). No regional inflammation. No biliary ductal dilatation. 2. No acute or inflammatory process identified; Moderate volume of retained stool in the large bowel. Electronically signed by: Helayne Hurst MD 06/08/2024 04:04 AM EST RP Workstation: [id number]     Procedures   Medications Ordered in the ED  acetaminophen  (OFIRMEV ) IV 1,000 mg (0 mg Intravenous Stopped 06/08/24 1015)  ondansetron  (ZOFRAN ) injection 4 mg (4 mg Intravenous Given 06/08/24 0341)  iohexol  (OMNIPAQUE ) 350 MG/ML injection 75 mL (75 mLs Intravenous Contrast Given 06/08/24 0354)  ketorolac  (TORADOL ) 15 MG/ML injection 15 mg (15 mg Intravenous Given 06/08/24 0836)  bisacodyl  (DULCOLAX) EC tablet 5 mg (5 mg Oral  Given 06/08/24 0916)                                    Medical Decision Making Amount and/or Complexity of Data Reviewed Labs: ordered. Radiology: ordered.  Risk OTC drugs. Prescription drug management.   This patient is a 28 y.o. female  who presents to the ED for concern of abdominal pain, nausea.   Differential diagnoses prior to evaluation: The emergent differential diagnosis includes, but is not limited to,  The causes of generalized abdominal pain include but are not limited to AAA, mesenteric ischemia, appendicitis, diverticulitis, DKA, gastritis, gastroenteritis, AMI, nephrolithiasis, pancreatitis, peritonitis, adrenal insufficiency,lead poisoning, iron toxicity, intestinal ischemia, constipation, UTI,SBO/LBO, splenic rupture, biliary disease, IBD, IBS, PUD, or hepatitis --given her recent cholecystectomy consider complication related to this procedure including abscess, surgical site infection, versus other. This is not an exhaustive differential.   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 Pertinent results include: Reviewed recent cholecystectomy, operative note, 10 days ago  Physical Exam: Physical exam performed. The pertinent findings include: Focal right upper quadrant tenderness, some guarding, no rebound, rigidity, vital signs stable throughout  Lab Tests/Imaging studies: I personally interpreted labs/imaging and the pertinent results include: CBC unremarkable, UA with some hemoglobin and small leukocytes, rare bacteria with some squamous contamination, overall low clinical suspicion for acute urinary tract infection.  CMP does have diffusely elevated transaminases, AST 172, ALT 109 and alkaline phosphatase of 139, may be secondary to her recent cholecystectomy, but could also represent early retained biliary ductal stone.  CT abdomen pelvis with contrast shows no  evidence of acute intra-abdominal abnormality, she has fairly significant constipation, expected changes of recent cholecystectomy with possible mild seroma.  I agree with the radiologist interpretation.  Medications: I ordered medication including IV Tylenol , Toradol , Dulcolax.  I have reviewed the patients home medicines and have made adjustments as needed.   Consults: I spoke with the surgeon, Dr. Tanda who recommended right upper quadrant ultrasound to further assess her abdominal pain, look for any retained stones, will plan for repeat LFTs, and close follow-up if no evidence of acute abnormality noted on her ultrasound today.   Right upper quadrant ultrasound without any acute abnormality, no retained stone appreciated, surgery recommends repeat check of LFTs  Disposition: After consideration of the diagnostic results and the patients response to treatment, I feel that patient stable for discharge with plan as above.   emergency department workup does not suggest an emergent condition requiring admission or immediate intervention beyond what has been performed at this time. The plan is: as above. The  patient is safe for discharge and has been instructed to return immediately for worsening symptoms, change in symptoms or any other concerns.   Final diagnoses:  Right upper quadrant abdominal pain  Constipation, unspecified constipation type  Elevated LFTs    ED Discharge Orders          Ordered    ibuprofen  (ADVIL ) 600 MG tablet  Every 6 hours PRN        06/08/24 1349    acetaminophen  (TYLENOL ) 500 MG tablet  Every 6 hours PRN        06/08/24 1349    bisacodyl  (DULCOLAX) 5 MG EC tablet  2 times daily        06/08/24 1349               Kimala Horne, Dora H, PA-C 06/08/24 1350    Franklyn Sid SAILOR, MD 06/09/24 2117

## 2024-06-08 NOTE — Discharge Instructions (Addendum)
 Please use Tylenol  or ibuprofen  for pain.  You may use 600 mg ibuprofen  every 6 hours or 1000 mg of Tylenol  every 6 hours.  You may choose to alternate between the 2.  This would be most effective.  Not to exceed 4 g of Tylenol  within 24 hours.  Not to exceed 3200 mg ibuprofen  24 hours.  Recommend drinking plenty of fluids, 54 to 60 ounces of water per day, you can use over-the-counter MiraLAX  up to twice daily in addition to the constipation medication that I provided.  Getting some physical activity, walking around could also aid in constipation.  Your surgeon recommends that you follow-up with them or your primary care doctor in around 1 week to recheck your liver function.

## 2024-06-08 NOTE — ED Notes (Signed)
 Patient transported to X-ray

## 2024-07-30 ENCOUNTER — Encounter (HOSPITAL_COMMUNITY): Payer: Self-pay

## 2024-07-30 ENCOUNTER — Other Ambulatory Visit: Payer: Self-pay

## 2024-07-30 ENCOUNTER — Emergency Department (HOSPITAL_COMMUNITY)
Admission: EM | Admit: 2024-07-30 | Discharge: 2024-07-30 | Disposition: A | Discharge status: Home / Self Care | Attending: Emergency Medicine | Admitting: Emergency Medicine

## 2024-07-30 DIAGNOSIS — L02413 Cutaneous abscess of right upper limb: Secondary | ICD-10-CM | POA: Insufficient documentation

## 2024-07-30 LAB — POC URINE PREG, ED: Preg Test, Ur: NEGATIVE

## 2024-07-30 MED ORDER — DOXYCYCLINE HYCLATE 100 MG PO CAPS: 100.0000 mg | ORAL_CAPSULE | Freq: Two times a day (BID) | ORAL | 0 refills | Status: AC

## 2024-07-30 MED ORDER — LIDOCAINE-EPINEPHRINE (PF) 2 %-1:200000 IJ SOLN
2.0000 mL | INTRAMUSCULAR | Status: DC
  Administered 2024-07-30: 2 mL

## 2024-07-30 MED ORDER — LIDOCAINE-EPINEPHRINE (PF) 2 %-1:200000 IJ SOLN
10.0000 mL | Freq: Once | INTRAMUSCULAR | Status: AC
  Administered 2024-07-30: 10 mL via INTRADERMAL
  Filled 2024-07-30: qty 20

## 2024-07-30 MED ORDER — IBUPROFEN 600 MG PO TABS: 600.0000 mg | ORAL_TABLET | Freq: Four times a day (QID) | ORAL | 0 refills | Status: AC | PRN

## 2024-07-30 NOTE — ED Provider Triage Note (Signed)
 Emergency Medicine Provider Triage Evaluation Note  Leslie Gilbert , a 29 y.o. female  was evaluated in triage.  Pt complains of skin infection. Pt report she think she may have been bitten by a spider to the dorsum of her R wrist several days ago. Notice increasing pain, swelling and redness.  No fever. LMP a week ago, and is UTD with tdap.    Review of Systems  Positive: As above Negative: As above  Physical Exam  BP 114/70 (BP Location: Right Arm)   Pulse 75   Temp 99.3 F (37.4 C)   Resp 18   SpO2 100%  Gen:   Awake, no distress   Resp:  Normal effort  MSK:   Moves extremities without difficulty  Other:  Abscess noted to dorsum of R wrist with surrounding skin erythema  Medical Decision Making  Medically screening exam initiated at 2:50 PM.  Appropriate orders placed.  Sakeenah M Niemeier was informed that the remainder of the evaluation will be completed by another provider, this initial triage assessment does not replace that evaluation, and the importance of remaining in the ED until their evaluation is complete.  Need I&D   Nivia Colon, PA-C 07/30/24 1452

## 2024-07-30 NOTE — ED Provider Notes (Signed)
 " [place] [facility] AT [facility] Provider Note   CSN: [id number] Arrival date & time: 07/30/24  1417     Patient presents with: Insect Bite   Leslie Gilbert is a 29 y.o. female.   The history is provided by the patient and medical records. No language interpreter was used.     29 year old female presenting with complaint of wrist pain.  Patient noticed pain swelling redness involving the dorsum of the right wrist ongoing for the past 2 days.  She believes she may have been bitten by a spider.  She denies having any fever or chills denies any numbness or weakness.  No history of IV drug use.  No specific treatment tried at home.  She is up-to-date with tetanus.  Last menstrual period was approximately a week ago.  Prior to Admission medications  Medication Sig Start Date End Date Taking? Authorizing Provider  acetaminophen  (TYLENOL ) 500 MG tablet Take 2 tablets (1,000 mg total) by mouth every 6 (six) hours as needed. 06/08/24   Prosperi, Christian H, PA-C  bisacodyl  (DULCOLAX) 5 MG EC tablet Take 2 tablets (10 mg total) by mouth 2 (two) times daily. 06/08/24   Prosperi, Christian H, PA-C  ibuprofen  (ADVIL ) 600 MG tablet Take 1 tablet (600 mg total) by mouth every 6 (six) hours as needed. 06/08/24   Prosperi, Christian H, PA-C  lidocaine  (LIDODERM ) 5 % Place 1 patch onto the skin daily. Remove & Discard patch within 12 hours or as directed by MD 05/31/24   Vicci Burnard SAUNDERS, PA-C  polyethylene glycol (MIRALAX  / GLYCOLAX ) 17 g packet Take 17 g by mouth daily as needed for mild constipation. 05/30/24   Vicci Burnard SAUNDERS, PA-C  simethicone  (MYLICON) 80 MG chewable tablet Chew 1 tablet (80 mg total) by mouth every 6 (six) hours as needed for flatulence. 05/30/24   Vicci Burnard SAUNDERS, PA-C    Allergies: Asa [aspirin]    Review of Systems  Constitutional:  Negative for fever.    Updated Vital Signs BP 114/70 (BP Location: Right Arm)   Pulse 75   Temp 99.3 F (37.4 C)    Resp 18   Ht 5' 5 (1.651 m)   Wt 66.7 kg   SpO2 100%   BMI 24.47 kg/m   Physical Exam Vitals and nursing note reviewed.  Constitutional:      General: She is not in acute distress.    Appearance: She is well-developed.  HENT:     Head: Atraumatic.  Eyes:     Conjunctiva/sclera: Conjunctivae normal.  Pulmonary:     Effort: Pulmonary effort is normal.  Musculoskeletal:        General: Tenderness (Right wrist: On the dorsum of the wrist there is an area of induration with fluctuant and surrounding skin erythema with tenderness to palpation.  It extending to the mid forearm as well as to the dorsum of the hand.  Radial pulse 2+.) present.     Cervical back: Neck supple.  Skin:    Findings: No rash.  Neurological:     Mental Status: She is alert.  Psychiatric:        Mood and Affect: Mood normal.     (all labs ordered are listed, but only abnormal results are displayed) Labs Reviewed  POC URINE PREG, ED    EKG: None  Radiology: No results found.   .Incision and Drainage: R wrist  Date/Time: 07/30/2024 7:25 PM  Performed by: Nivia Colon, PA-C Authorized by: Nivia,  Lonni, PA-C   Consent:    Consent obtained:  Verbal   Consent given by:  Patient   Risks discussed:  Bleeding, incomplete drainage, pain and damage to other organs   Alternatives discussed:  No treatment Universal protocol:    Procedure explained and questions answered to patient or proxy's satisfaction: yes     Relevant documents present and verified: yes     Test results available : yes     Imaging studies available: yes     Required blood products, implants, devices, and special equipment available: yes     Site/side marked: yes     Immediately prior to procedure, a time out was called: yes     Patient identity confirmed:  Verbally with patient Location:    Type:  Abscess   Size:  3   Location:  Upper extremity   Upper extremity location:  Wrist (dorsum of R wrist)   Wrist location:  R  wrist Pre-procedure details:    Skin preparation:  Betadine Anesthesia:    Anesthesia method:  Local infiltration   Local anesthetic:  2 mL lidocaine -EPINEPHrine  2 %-1:200000 Procedure type:    Complexity:  Complex Procedure details:    Incision types:  Single straight   Incision depth:  Subcutaneous   Wound management:  Probed and deloculated, irrigated with saline and extensive cleaning   Drainage:  Purulent   Drainage amount:  Scant   Packing materials:  None Post-procedure details:    Procedure completion:  Tolerated well, no immediate complications    Medications Ordered in the ED  lidocaine -EPINEPHrine  (XYLOCAINE  W/EPI) 2 %-1:200000 (PF) injection 10 mL (has no administration in time range)                                    Medical Decision Making Risk Prescription drug management.   BP 114/70 (BP Location: Right Arm)   Pulse 75   Temp 99.3 F (37.4 C)   Resp 18   Ht 5' 5 (1.651 m)   Wt 66.7 kg   SpO2 100%   BMI 24.47 kg/m   76:26 PM  29 year old female presenting with complaint of wrist pain.  Patient noticed pain swelling redness involving the dorsum of the right wrist ongoing for the past 2 days.  She believes she may have been bitten by a spider.  She denies having any fever or chills denies any numbness or weakness.  No history of IV drug use.  No specific treatment tried at home.  She is up-to-date with tetanus.  Last menstrual period was approximately a week ago.  Examination remarkable for evidence of an abscess noted to the dorsum of the right wrist.  There is surrounding skin cellulitis.  Patient is neurovascularly intact.  I was able to perform incision and drainage with scant amount of purulent discharge noted.  Pregnancy test is negative.  Patient will be treated with antibiotic, recommend warm compresses, and recommend returning in 48 hours if no improvement.  Patient voiced understanding and agrees to plan.  She is stable for discharge.      Final diagnoses:  Cutaneous abscess of right wrist    ED Discharge Orders          Ordered    doxycycline  (VIBRAMYCIN ) 100 MG capsule  2 times daily        07/30/24 1930    ibuprofen  (ADVIL ) 600 MG tablet  Every 6 hours  PRN        07/30/24 1930               Nivia Colon, PA-C 07/30/24 1931    Tegeler, Lonni PARAS, MD 07/30/24 2357  "

## 2024-07-30 NOTE — Discharge Instructions (Signed)
 You have been treated for an abscess.  Please apply as warm moist compress to affected area several times daily to aid with healing.  Take antibiotic as prescribed.  Take ibuprofen  as needed for pain.  If you notice no improvement of symptoms after 48 hours please return.

## 2024-07-30 NOTE — ED Triage Notes (Signed)
 Pt reports spider bite to right wrist that first appeared 2 days ago. Today site is red and painful.
